# Patient Record
Sex: Female | Born: 1980 | Race: Black or African American | Hispanic: No | Marital: Single | Smoking: Never smoker
Health system: Southern US, Community
[De-identification: ages and names within clinical notes are randomized; demographics above are authoritative.]

## PROBLEM LIST (undated history)

## (undated) DIAGNOSIS — J302 Other seasonal allergic rhinitis: Secondary | ICD-10-CM

## (undated) DIAGNOSIS — Z789 Other specified health status: Secondary | ICD-10-CM

## (undated) DIAGNOSIS — K219 Gastro-esophageal reflux disease without esophagitis: Secondary | ICD-10-CM

## (undated) HISTORY — DX: Gastro-esophageal reflux disease without esophagitis: K21.9

## (undated) HISTORY — DX: Other seasonal allergic rhinitis: J30.2

## (undated) HISTORY — PX: NO PAST SURGERIES: SHX2092

---

## 2000-09-24 ENCOUNTER — Other Ambulatory Visit: Admission: RE | Admit: 2000-09-24 | Discharge: 2000-09-24 | Payer: Self-pay | Admitting: Family Medicine

## 2002-10-08 ENCOUNTER — Ambulatory Visit (HOSPITAL_COMMUNITY): Admission: RE | Admit: 2002-10-08 | Discharge: 2002-10-08 | Payer: Self-pay | Admitting: Family Medicine

## 2002-10-08 ENCOUNTER — Encounter: Payer: Self-pay | Admitting: Family Medicine

## 2005-01-15 ENCOUNTER — Emergency Department (HOSPITAL_COMMUNITY): Admission: EM | Admit: 2005-01-15 | Discharge: 2005-01-15 | Payer: Self-pay | Admitting: Emergency Medicine

## 2005-10-30 ENCOUNTER — Ambulatory Visit: Payer: Self-pay | Admitting: Family Medicine

## 2006-01-27 ENCOUNTER — Ambulatory Visit: Payer: Self-pay | Admitting: Family Medicine

## 2006-01-28 ENCOUNTER — Ambulatory Visit (HOSPITAL_COMMUNITY): Admission: RE | Admit: 2006-01-28 | Discharge: 2006-01-28 | Payer: Self-pay | Admitting: Family Medicine

## 2006-02-03 ENCOUNTER — Encounter (HOSPITAL_COMMUNITY): Admission: RE | Admit: 2006-02-03 | Discharge: 2006-03-05 | Payer: Self-pay | Admitting: Family Medicine

## 2006-02-10 ENCOUNTER — Ambulatory Visit: Payer: Self-pay | Admitting: Family Medicine

## 2006-03-13 ENCOUNTER — Encounter: Admission: RE | Admit: 2006-03-13 | Discharge: 2006-03-13 | Payer: Self-pay | Admitting: General Surgery

## 2006-03-25 ENCOUNTER — Ambulatory Visit: Payer: Self-pay | Admitting: Family Medicine

## 2006-03-25 ENCOUNTER — Ambulatory Visit (HOSPITAL_COMMUNITY): Admission: RE | Admit: 2006-03-25 | Discharge: 2006-03-25 | Payer: Self-pay | Admitting: Family Medicine

## 2006-04-01 ENCOUNTER — Ambulatory Visit: Payer: Self-pay | Admitting: Gastroenterology

## 2006-04-08 ENCOUNTER — Ambulatory Visit (HOSPITAL_COMMUNITY): Admission: RE | Admit: 2006-04-08 | Discharge: 2006-04-08 | Payer: Self-pay | Admitting: Gastroenterology

## 2006-04-08 ENCOUNTER — Ambulatory Visit: Payer: Self-pay | Admitting: Gastroenterology

## 2006-04-08 ENCOUNTER — Encounter (INDEPENDENT_AMBULATORY_CARE_PROVIDER_SITE_OTHER): Payer: Self-pay | Admitting: Specialist

## 2006-04-08 HISTORY — PX: ESOPHAGOGASTRODUODENOSCOPY: SHX1529

## 2006-04-14 ENCOUNTER — Ambulatory Visit: Payer: Self-pay | Admitting: Family Medicine

## 2006-06-27 ENCOUNTER — Ambulatory Visit: Payer: Self-pay | Admitting: Family Medicine

## 2006-08-26 ENCOUNTER — Ambulatory Visit: Payer: Self-pay | Admitting: Family Medicine

## 2006-09-08 ENCOUNTER — Ambulatory Visit: Payer: Self-pay | Admitting: Family Medicine

## 2007-01-22 ENCOUNTER — Encounter: Payer: Self-pay | Admitting: Family Medicine

## 2007-02-09 ENCOUNTER — Ambulatory Visit: Payer: Self-pay | Admitting: Family Medicine

## 2007-09-04 ENCOUNTER — Telehealth (INDEPENDENT_AMBULATORY_CARE_PROVIDER_SITE_OTHER): Payer: Self-pay | Admitting: *Deleted

## 2007-11-30 ENCOUNTER — Other Ambulatory Visit: Admission: RE | Admit: 2007-11-30 | Discharge: 2007-11-30 | Payer: Self-pay | Admitting: Family Medicine

## 2007-11-30 ENCOUNTER — Encounter (INDEPENDENT_AMBULATORY_CARE_PROVIDER_SITE_OTHER): Payer: Self-pay | Admitting: Family Medicine

## 2007-11-30 ENCOUNTER — Ambulatory Visit: Payer: Self-pay | Admitting: Family Medicine

## 2007-11-30 DIAGNOSIS — Z8669 Personal history of other diseases of the nervous system and sense organs: Secondary | ICD-10-CM | POA: Insufficient documentation

## 2008-06-28 ENCOUNTER — Ambulatory Visit: Payer: Self-pay | Admitting: Family Medicine

## 2008-06-28 LAB — CONVERTED CEMR LAB
Bilirubin Urine: NEGATIVE
Blood in Urine, dipstick: NEGATIVE
Nitrite: NEGATIVE
Specific Gravity, Urine: 1.015
Urobilinogen, UA: 0.2
WBC Urine, dipstick: NEGATIVE

## 2008-06-29 ENCOUNTER — Encounter (INDEPENDENT_AMBULATORY_CARE_PROVIDER_SITE_OTHER): Payer: Self-pay | Admitting: Family Medicine

## 2008-07-14 ENCOUNTER — Ambulatory Visit: Payer: Self-pay | Admitting: Family Medicine

## 2008-07-14 DIAGNOSIS — I872 Venous insufficiency (chronic) (peripheral): Secondary | ICD-10-CM | POA: Insufficient documentation

## 2008-10-20 ENCOUNTER — Encounter (INDEPENDENT_AMBULATORY_CARE_PROVIDER_SITE_OTHER): Payer: Self-pay | Admitting: Family Medicine

## 2010-02-11 ENCOUNTER — Encounter: Payer: Self-pay | Admitting: Family Medicine

## 2010-02-19 ENCOUNTER — Other Ambulatory Visit: Payer: Self-pay | Admitting: Family Medicine

## 2010-02-19 ENCOUNTER — Other Ambulatory Visit (HOSPITAL_COMMUNITY)
Admission: RE | Admit: 2010-02-19 | Discharge: 2010-02-19 | Disposition: A | Discharge status: Home / Self Care | Payer: Self-pay | Source: Ambulatory Visit | Attending: Family Medicine | Admitting: Family Medicine

## 2010-02-19 DIAGNOSIS — Z124 Encounter for screening for malignant neoplasm of cervix: Secondary | ICD-10-CM | POA: Insufficient documentation

## 2010-02-20 NOTE — Letter (Signed)
Summary: rpc chart  rpc chart   Imported By: Curtis Sites 08/14/2009 15:07:43  _____________________________________________________________________  External Attachment:    Type:   Image     Comment:   External Document

## 2010-06-08 NOTE — Consult Note (Signed)
Vanessa [name], [name]             ACCOUNT NO.:  [id number]   MEDICAL RECORD NO.:  [id number]          PATIENT TYPE:  AMB   LOCATION:  DAY                           FACILITY:  [facility]   PHYSICIAN:  Kassie Mends, M.D.      DATE OF BIRTH:  1980-06-22   DATE OF CONSULTATION:  04/01/2006  DATE OF DISCHARGE:                                 CONSULTATION   CHIEF COMPLAINT:  Intermittent abdominal pain, bloating for 4 months.   PHYSICIAN REQUESTING CONSULTATION:  Milus Mallick. Lodema Hong, M.D.   HISTORY OF PRESENT ILLNESS:  The patient is a 30 year old African  American female, a patient of Dr. Syliva Overman, who presents for  further evaluation of recurrent abdominal pain and bloating.  For the  past 4 months she has had intermittent episodes.  Predominantly, her  pain is in the epigastrium, left upper quadrant region.  It does not  always appear to be related to meals.  It has woken her up from sleep at  night on several occasions.  The pain generally lasts for 2-3 hours.  There is no associated change in bowel movements.  Her bowel movements  are very regular.  No melena or rectal bleeding.  She has had some  feeling of heartburn and often feels as if her food is not going down  very well.  She has had no vomiting, however.  She complains of a  bloating type feeling.  On five occasions she has had postprandial right  upper quadrant abdominal pain, which lasts for about an hour.  She  states this is unrelated to her other symptoms.   Her workup thus far has included an abdominal ultrasound, which revealed  possible small bilateral nonobstructive renal calculi.  She had a HIDA  scan which revealed a gallbladder ejection fraction of 36%.  Normal is  50% for a fatty meal.  She had a CT of the abdomen and pelvis with  contrast which was unremarkable.  She states she saw a Development worker, international aid  in [place], Dr. Lurene Shadow, and he recommended that she see a  gastroenterologist prior to consideration of  any surgical intervention  at this time.  She states that he told her that he did not feel all of  her symptoms were gallbladder related.   CURRENT MEDICATIONS:  None.  She has not been on any PPI therapy,  although Dr. Lodema Hong did provide her with Aciphex, but she has not  started taking it.  She denies taking any over-the-counter agents.   ALLERGIES:  NO KNOWN DRUG ALLERGIES.   PAST MEDICAL HISTORY:  History of migraines occurring three times a  month on average.   PAST SURGICAL HISTORY:  None.   FAMILY HISTORY:  Mother has had her gallbladder out.  No family history  of colorectal cancer or peptic ulcer disease.   SOCIAL HISTORY:  She is single without children.  She is self-employed  as a Social worker.  She is a nonsmoker.  No alcohol use   REVIEW OF SYSTEMS:  See HPI for GI.  CONSTITUTIONAL:  She has had about  a 10-pound weight  gain in the last 1-2 years.  CARDIOPULMONARY:  No  chest pain or shortness of breath.  GENITOURINARY:  Irregular menses  since age 31.  Last menstrual cycle began 45 days ago.  She reports a  negative pregnancy test.  NEUROLOGIC:  Intermittent migraines seem to be  unrelated to her abdominal pain.   PHYSICAL EXAMINATION:  VITAL SIGNS:  Weight 132, height 5 feet 4 inches,  temp 97.4, blood pressure 98/64, pulse 64.  GENERAL:  A pleasant well-nourished, well-developed young female in no  acute distress.  SKIN:  Warm and dry.  No jaundice.  HEENT:  Sclerae nonicteric.  Oropharyngeal mucosa moist and pink.  CHEST:  Lungs clear to auscultation.  CARDIAC:  Reveals a regular rate and rhythm.  Normal S1-S2.  No murmurs,  rubs or gallops.  ABDOMEN:  Positive bowel sounds.  Abdomen is soft.  She has mild  epigastric tenderness to deep palpation.  No organomegaly or masses.  No  rebound tenderness or guarding.  No abdominal bruits or hernias.  EXTREMITIES:  No edema.   IMPRESSION:  Vanessa [name] is a 30 year old lady with a 40-month history of  intermittent  upper abdominal pain predominantly in epigastrium, left  upper quadrant region.  She does describe some typical reflux symptoms  and dysphagia as well.  She has had several episodes of right upper  quadrant abdominal pain which has been postprandial and may be related  to her gallbladder, but all of her symptoms are not clearly explained by  gallbladder dysfunction.   I do recommend further evaluation of her upper GI tract via EGD.  I  discussed risk, alternatives, and benefits with the patient and she is  agreeable to proceed.   PLAN:  1. EGD with Dr. Cira Servant.  2. Encourage her to take AcipHex 20 mg daily, as requested by Dr.      Lodema Hong previously.  3. Further recommendations to follow.      Tana Coast, P.A.      Kassie Mends, M.D.  Electronically Signed    LL/MEDQ  D:  04/01/2006  T:  04/03/2006  Job:  [id number]   cc:   Milus Mallick. Lodema Hong, M.D.  Fax: [phone]

## 2010-06-08 NOTE — Op Note (Signed)
NAMEGAYLEN, PEREIRA             ACCOUNT NO.:  [id number]   MEDICAL RECORD NO.:  [id number]          PATIENT TYPE:  AMB   LOCATION:  DAY                           FACILITY:  [facility]   PHYSICIAN:  Vanessa [name], M.D.      DATE OF BIRTH:  06/10/1980   DATE OF PROCEDURE:  04/08/2006  DATE OF DISCHARGE:  04/08/2006                               OPERATIVE REPORT   REFERRING PHYSICIAN:  Dr. Syliva Overman.   PROCEDURE:  Esophagogastroduodenoscopy with cold forceps biopsy and  Savary dilation.   INDICATION FOR EXAM:  Vanessa [name] is a 30 year old female with solid  dysphagia and abdominal pain located in the right upper quadrant, left  upper quadrant and periumbilical region that did not respond AcipHex.  She had been seen and evaluated by general surgery in [facility] who  felt that her abdominal pain was multifactorial needed to be further  evaluated.   FINDINGS:  1. 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  Her esophagus was successively dilated with      the Savary dilators from 13 mm to 16 mm.  The 16-mm Savary dilator      was passed with mild to moderate resistance.  Otherwise the distal      esophagus was without evidence of Barrett's, erosion or ulceration.  2. Mild antral erythema.  Biopsies obtained to evaluate for H pylori      infection or eosinophilic gastritis.  3. Normal duodenal bulb, and second portion of the duodenum.   RECOMMENDATIONS:  1. Clear liquid diet for 8 hours then resume previous diet except      avoid gastric irritants.  Vanessa [name] was given handout on gastric      irritants.  She is also given a handout on gastritis.  2. Will call Vanessa [name] with the results of her biopsy.  3. She should avoid aspirin, NSAIDs and anticoagulation for three      days.  4. She has a follow-up appointment to see me in one month to re-      evaluate her dysphagia and abdominal pain.   MEDICATIONS:  1. Demerol 75 mg IV.  2.  Versed 4 mg IV.   PROCEDURE TECHNIQUE:  Physical exam was performed and informed consent  was obtained from the patient after explaining the benefits, risks and  alternatives to the procedure.  The patient was connected to monitor and  placed in the left lateral position continuous oxygen was provided by  nasal cannula at and IV medicine administered through an indwelling  cannula.  After administration of sedation, the patient's esophagus  intubated.  The scope was  advanced direct visualization to second portion of duodenum.  The scope  was subsequently slowly by carefully examining color, texture, anatomy,  and integrity of the mucosa on the way out.  The patient was recovered  in the endoscopy suite and discharged home in satisfactory condition.      Vanessa [name], M.D.  Electronically Signed     SM/MEDQ  D:  04/09/2006  T:  04/10/2006  Job:  [id number]   cc:   Milus Mallick. Lodema Hong, M.D.  Fax: [phone]

## 2010-06-21 ENCOUNTER — Ambulatory Visit (INDEPENDENT_AMBULATORY_CARE_PROVIDER_SITE_OTHER): Payer: PRIVATE HEALTH INSURANCE | Admitting: Family Medicine

## 2010-06-21 ENCOUNTER — Encounter: Payer: Self-pay | Admitting: Family Medicine

## 2010-06-21 VITALS — BP 110/80 | HR 75 | Temp 98.7°F | Resp 16 | Ht 64.0 in | Wt 120.1 lb

## 2010-06-21 DIAGNOSIS — Z23 Encounter for immunization: Secondary | ICD-10-CM

## 2010-06-21 DIAGNOSIS — J0100 Acute maxillary sinusitis, unspecified: Secondary | ICD-10-CM | POA: Insufficient documentation

## 2010-06-21 DIAGNOSIS — Z139 Encounter for screening, unspecified: Secondary | ICD-10-CM

## 2010-06-21 DIAGNOSIS — J40 Bronchitis, not specified as acute or chronic: Secondary | ICD-10-CM

## 2010-06-21 MED ORDER — AZITHROMYCIN 250 MG PO TABS: ORAL_TABLET | ORAL | Status: AC

## 2010-06-21 MED ORDER — CEFTRIAXONE SODIUM 1 G IJ SOLR
500.0000 mg | Freq: Once | INTRAMUSCULAR | Status: AC
  Administered 2010-06-21: 500 mg via INTRAMUSCULAR

## 2010-06-21 MED ORDER — PREDNISONE (PAK) 5 MG PO TABS: 5.0000 mg | ORAL_TABLET | ORAL | Status: AC

## 2010-06-21 MED ORDER — FLUCONAZOLE 150 MG PO TABS: 150.0000 mg | ORAL_TABLET | Freq: Once | ORAL | Status: AC

## 2010-06-21 MED ORDER — METHYLPREDNISOLONE ACETATE 80 MG/ML IJ SUSP
80.0000 mg | Freq: Once | INTRAMUSCULAR | Status: AC
  Administered 2010-06-21: 80 mg via INTRAMUSCULAR

## 2010-06-21 NOTE — Patient Instructions (Addendum)
You  Are being treated for left maxillary sinusitis and acute bronchitis.  Medication is sent in and you will get injections of rocephin and depomedrol in the office , also TDaP vaccine.  I recommend fasting labs at your earliest convenience, cBC, chem 7, lipid ,and TSH    I Hope you feel well soon, call if not.  You will get mucinex d take one twice daily for the next 3 to 5 days also

## 2010-06-21 NOTE — Progress Notes (Signed)
  Subjective:    Patient ID: Vanessa [name], female    DOB: 1981/01/15, 30 y.o.   MRN: [id number]  HPI 4 dy h/o sinus pressure with excescsscive cough with chest discomfort, chills, no documented fever. Prior to this she had been doing well No labs in years, questions about vitamins. Needs immunization    Review of Systems See  hPI Denies palpitations, paroxysmal nocturnal dyspnea, orthopnea and leg swelling Denies abdominal pain, nausea, vomiting,diarrhea or constipation.  Denies rectal bleeding or change in bowel movement. Denies dysuria, frequency, hesitancy or incontinence. Denies joint pain, swelling and limitation in mobility. Denies seizure, numbness, or tingling. Denies depression, anxiety or insomnia. Denies skin break down or rash. Reports headache, generalized pains, chest pain with cough        Objective:   Physical Exam Patient alert and oriented and in no Cardiopulmonary distress.Ill appearing  HEENT: No facial asymmetry, EOMI, left maxillary sinus tenderness, TM's dull, Oropharynx pink and moist.  Neck supple adenitis  Chest: decreased air entry , bilateral crackles, no whezes CVS: S1, S2 no murmurs, no S3.  ABD: Soft non tender. Bowel sounds normal.  Ext: No edema  MS: Adequate ROM spine, shoulders, hips and knees.  Skin: Intact, no ulcerations or rash noted.  Psych: Good eye contact, normal affect. Memory intact not anxious or depressed appearing.  CNS: CN 2-12 intact, power,  normal throughout.        Assessment & Plan:

## 2010-06-24 DIAGNOSIS — J40 Bronchitis, not specified as acute or chronic: Secondary | ICD-10-CM | POA: Insufficient documentation

## 2010-06-24 NOTE — Assessment & Plan Note (Signed)
Med prescribed, advised, fluids , rest, and samples given of decongestant and expectorant

## 2010-06-24 NOTE — Assessment & Plan Note (Signed)
Acute episode, rocephin administered and z pack prescribed, also prednisone dose pack

## 2010-11-28 ENCOUNTER — Telehealth: Payer: Self-pay | Admitting: Family Medicine

## 2010-11-28 ENCOUNTER — Other Ambulatory Visit: Payer: Self-pay | Admitting: Family Medicine

## 2010-11-28 MED ORDER — IBUPROFEN 800 MG PO TABS: ORAL_TABLET | ORAL | Status: DC

## 2010-11-28 NOTE — Telephone Encounter (Signed)
Requests script of ibuprofen for menstrual cramps be sent in, will do same

## 2011-01-23 ENCOUNTER — Ambulatory Visit (HOSPITAL_COMMUNITY)
Admission: RE | Admit: 2011-01-23 | Discharge: 2011-01-23 | Disposition: A | Discharge status: Home / Self Care | Payer: No Typology Code available for payment source | Source: Ambulatory Visit | Attending: Family Medicine | Admitting: Family Medicine

## 2011-01-23 ENCOUNTER — Encounter: Payer: Self-pay | Admitting: Family Medicine

## 2011-01-23 ENCOUNTER — Telehealth: Payer: Self-pay

## 2011-01-23 ENCOUNTER — Ambulatory Visit (INDEPENDENT_AMBULATORY_CARE_PROVIDER_SITE_OTHER): Payer: PRIVATE HEALTH INSURANCE | Admitting: Family Medicine

## 2011-01-23 VITALS — BP 94/74 | HR 113 | Resp 16 | Ht 64.0 in | Wt 126.0 lb

## 2011-01-23 DIAGNOSIS — M25519 Pain in unspecified shoulder: Secondary | ICD-10-CM

## 2011-01-23 DIAGNOSIS — M25511 Pain in right shoulder: Secondary | ICD-10-CM

## 2011-01-23 DIAGNOSIS — R5383 Other fatigue: Secondary | ICD-10-CM

## 2011-01-23 DIAGNOSIS — W19XXXA Unspecified fall, initial encounter: Secondary | ICD-10-CM | POA: Insufficient documentation

## 2011-01-23 DIAGNOSIS — R079 Chest pain, unspecified: Secondary | ICD-10-CM | POA: Insufficient documentation

## 2011-01-23 DIAGNOSIS — R5381 Other malaise: Secondary | ICD-10-CM

## 2011-01-23 DIAGNOSIS — M79609 Pain in unspecified limb: Secondary | ICD-10-CM | POA: Insufficient documentation

## 2011-01-23 DIAGNOSIS — S298XXA Other specified injuries of thorax, initial encounter: Secondary | ICD-10-CM | POA: Insufficient documentation

## 2011-01-23 MED ORDER — IBUPROFEN 800 MG PO TABS: 800.0000 mg | ORAL_TABLET | Freq: Three times a day (TID) | ORAL | Status: AC | PRN

## 2011-01-23 NOTE — Patient Instructions (Signed)
F/u as needed.  pls take ibuprofen 800mg  one three times daily for 10 days, you have soft tissue injury to shoulder and chest wall following the fall.  xrays are ordered to evaluate/rule out fracture, you will be notified of result  pls call and let me know if symptoms are not resolved in 10 days, you will be referred for further management at that time  Fasting labs need to be done

## 2011-01-23 NOTE — Progress Notes (Signed)
Subjective:     Patient ID: Vanessa [name], female   DOB: 07-26-1980, 31 y.o.   MRN: [id number]  HPI Larey Seat while running in a parking lot 5 days ago, direct trauma to hands knees , chest and abdomen.  C/o severe right shoulder pain from ant shoulder to mid upper arm, has had this to some extent in the past, limits mobility of shoulder.  C/o ant chest wall pain, lower chest and epigastric. No head trauma or lacerations.  Denies significant pain in hands and knees   Review of Systems See HPI Denies recent fever or chills. Denies sinus pressure, nasal congestion, ear pain or sore throat. Denies chest congestion, productive cough or wheezing.  Denies skin break down or rash.        Objective:   Physical Exam Patient alert and oriented and in no cardiopulmonary distress.  HEENT: No facial asymmetry, EOMI, no sinus tenderness,  oropharynx pink and moist.  Neck supple no adenopathy.  Chest: Clear to auscultation bilaterally.Tender over lower chest wall anteriorly  CVS: S1, S2 no murmurs, no S3.  ABD: Soft non tender. Bowel sounds normal.  Ext: No edema  MS: Adequate ROM spine, , hips and knees.Decreased in right shoulder, tender over right upper arm  Skin: Intact, no ulcerations or rash noted.  Psych: Good eye contact, normal affect. Memory intact not anxious or depressed appearing.  CNS: CN 2-12 intact, power,  normal throughout.     Assessment:        Plan:

## 2011-01-23 NOTE — Telephone Encounter (Signed)
States she fell Saturday night onto her front right side. She went to urgent care but they do not take her insurance. Wanted to come in here. She is at work but her right shoulder is hurting to lift it up and her right ribs and kneecap is very sore. She does not want to go to ER but wants to see if you will work her in today. Please advise Cell [phone]

## 2011-01-23 NOTE — Telephone Encounter (Signed)
Will work at [employer] today, pt aware

## 2011-01-27 NOTE — Assessment & Plan Note (Signed)
Recent fall with increase pain and limitation in mobility. Clinically low suspicion for fracture, x ray to be obtained to confirm Anti inflammatories aggressively x 7 to 10 days

## 2011-02-25 ENCOUNTER — Ambulatory Visit (HOSPITAL_COMMUNITY): Payer: PRIVATE HEALTH INSURANCE | Admitting: Occupational Therapy

## 2011-02-26 ENCOUNTER — Ambulatory Visit (HOSPITAL_COMMUNITY): Payer: PRIVATE HEALTH INSURANCE | Admitting: Occupational Therapy

## 2011-03-04 ENCOUNTER — Encounter: Payer: PRIVATE HEALTH INSURANCE | Admitting: Family Medicine

## 2011-03-05 ENCOUNTER — Ambulatory Visit (HOSPITAL_COMMUNITY): Payer: PRIVATE HEALTH INSURANCE | Admitting: Occupational Therapy

## 2011-03-30 LAB — CBC
MCH: 29.5 pg (ref 26.0–34.0)
MCHC: 31.8 g/dL (ref 30.0–36.0)
MCV: 92.9 fL (ref 78.0–100.0)
Platelets: 321 10*3/uL (ref 150–400)
RDW: 12.8 % (ref 11.5–15.5)
WBC: 4.9 10*3/uL (ref 4.0–10.5)

## 2011-03-30 LAB — COMPREHENSIVE METABOLIC PANEL
Albumin: 4.4 g/dL (ref 3.5–5.2)
Alkaline Phosphatase: 51 U/L (ref 39–117)
BUN: 19 mg/dL (ref 6–23)
CO2: 25 mEq/L (ref 19–32)
Calcium: 9.4 mg/dL (ref 8.4–10.5)
Potassium: 4.5 mEq/L (ref 3.5–5.3)
Total Bilirubin: 0.3 mg/dL (ref 0.3–1.2)
Total Protein: 6.9 g/dL (ref 6.0–8.3)

## 2011-03-30 LAB — LIPID PANEL
HDL: 63 mg/dL (ref >39)
LDL Cholesterol: 80 mg/dL (ref 0–99)
Total CHOL/HDL Ratio: 2.4 Ratio
Triglycerides: 35 mg/dL (ref <150)

## 2011-04-01 ENCOUNTER — Ambulatory Visit (INDEPENDENT_AMBULATORY_CARE_PROVIDER_SITE_OTHER): Payer: No Typology Code available for payment source | Admitting: Family Medicine

## 2011-04-01 ENCOUNTER — Other Ambulatory Visit (HOSPITAL_COMMUNITY)
Admission: RE | Admit: 2011-04-01 | Discharge: 2011-04-01 | Disposition: A | Discharge status: Home / Self Care | Payer: No Typology Code available for payment source | Source: Ambulatory Visit | Attending: Family Medicine | Admitting: Family Medicine

## 2011-04-01 ENCOUNTER — Encounter: Payer: Self-pay | Admitting: Family Medicine

## 2011-04-01 VITALS — BP 100/60 | HR 78 | Resp 16 | Ht 64.0 in | Wt 128.4 lb

## 2011-04-01 DIAGNOSIS — K649 Unspecified hemorrhoids: Secondary | ICD-10-CM

## 2011-04-01 DIAGNOSIS — M25511 Pain in right shoulder: Secondary | ICD-10-CM

## 2011-04-01 DIAGNOSIS — Z124 Encounter for screening for malignant neoplasm of cervix: Secondary | ICD-10-CM

## 2011-04-01 DIAGNOSIS — M25519 Pain in unspecified shoulder: Secondary | ICD-10-CM

## 2011-04-01 DIAGNOSIS — Z01419 Encounter for gynecological examination (general) (routine) without abnormal findings: Secondary | ICD-10-CM | POA: Insufficient documentation

## 2011-04-01 DIAGNOSIS — K648 Other hemorrhoids: Secondary | ICD-10-CM | POA: Insufficient documentation

## 2011-04-01 DIAGNOSIS — Z0000 Encounter for general adult medical examination without abnormal findings: Secondary | ICD-10-CM

## 2011-04-01 MED ORDER — DICLOFENAC SODIUM 1 % TD GEL: TRANSDERMAL | Status: DC

## 2011-04-01 MED ORDER — HYDROCORTISONE ACETATE 25 MG RE SUPP: 25.0000 mg | Freq: Two times a day (BID) | RECTAL | Status: DC

## 2011-04-01 MED ORDER — IBUPROFEN 600 MG PO TABS: ORAL_TABLET | ORAL | Status: AC

## 2011-04-01 NOTE — Patient Instructions (Signed)
CPE in 12 month.  Please commit to regular exercise, including stretches and exercises for your tummy, also healthy food choices.  Medication is prescribed for shoulder pain and also for cramps , do not use the ibuprofen the same day that you use the voltaren gel.  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

## 2011-04-06 NOTE — Assessment & Plan Note (Signed)
Improved, topical and systemic med prescribed for as needed use independently

## 2011-04-06 NOTE — Progress Notes (Signed)
  Subjective:    Patient ID: Vanessa [name], female    DOB: 10-Jan-1981, 31 y.o.   MRN: [id number]  HPI The PT is here for annual exam aedication management and review of any available recent lab and radiology data.  Preventive health is updated, specifically  Cancer screening and Immunization.   Questions or concerns regarding consultations or procedures which the PT has had in the interim are  Addressed.Has seen orthopedics regarding righ shoulder pain, did not f/u with therapy and is using no medication consistently. Recent bright red rectal blood x 3 occasions in the past week, associated with pain and straining      Review of Systems See HPI Denies recent fever or chills. Denies sinus pressure, nasal congestion, ear pain or sore throat. Denies chest congestion, productive cough or wheezing. Denies chest pains, palpitations and leg swelling Denies abdominal pain, nausea, vomiting,diarrhea or constipation.   Denies dysuria, frequency, hesitancy or incontinence. Denies headaches, seizures, numbness, or tingling. Denies depression, anxiety or insomnia. Denies skin break down or rash.        Objective:   Physical Exam Pleasant well nourished female, alert and oriented x 3, in no cardio-pulmonary distress. Afebrile. HEENT No facial trauma or asymetry. Sinuses non tender.  EOMI, PERTL, fundoscopic exam is normal, no hemorhage or exudate.  External ears normal, tympanic membranes clear. Oropharynx moist, no exudate, good dentition. Neck: supple, no adenopathy,JVD or thyromegaly.No bruits.  Chest: Clear to ascultation bilaterally.No crackles or wheezes. Non tender to palpation  Breast: No asymetry,no masses. No nipple discharge or inversion. No axillary or supraclavicular adenopathy  Cardiovascular system; Heart sounds normal,  S1 and  S2 ,no S3.  No murmur, or thrill. Apical beat not displaced Peripheral pulses normal.  Abdomen: Soft, non tender, no organomegaly or  masses. No bruits. Bowel sounds normal. No guarding, tenderness or rebound.  Rectal:  Hemmorhoid. 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  GU: External genitalia normal. No lesions. Vaginal canal normal.No discharge. Uterus normal size, no adnexal masses, no cervical motion or adnexal tenderness.  Musculoskeletal exam: Full ROM of spine, hips , shoulders and knees. No deformity ,swelling or crepitus noted. No muscle wasting or atrophy.   Neurologic: Cranial nerves 2 to 12 intact. Power, tone ,sensation and reflexes normal throughout. No disturbance in gait. No tremor.  Skin: Intact, no ulceration, erythema , scaling or rash noted. Pigmentation normal throughout  Psych; Normal mood and affect. Judgement and concentration normal        Assessment & Plan:

## 2011-04-06 NOTE — Assessment & Plan Note (Signed)
counseled re importance of not straining at  Stool , which she readily admits to. Lifestyle change , and use of suposotories short term to shrink hemmorhoid. If bleeding persists will need GI eval

## 2011-04-23 ENCOUNTER — Ambulatory Visit (INDEPENDENT_AMBULATORY_CARE_PROVIDER_SITE_OTHER): Payer: No Typology Code available for payment source | Admitting: Family Medicine

## 2011-04-23 ENCOUNTER — Encounter: Payer: Self-pay | Admitting: Family Medicine

## 2011-04-23 ENCOUNTER — Telehealth: Payer: Self-pay | Admitting: Family Medicine

## 2011-04-23 VITALS — BP 118/72 | HR 67 | Resp 18 | Ht 64.0 in | Wt 130.1 lb

## 2011-04-23 DIAGNOSIS — N946 Dysmenorrhea, unspecified: Secondary | ICD-10-CM

## 2011-04-23 DIAGNOSIS — K649 Unspecified hemorrhoids: Secondary | ICD-10-CM

## 2011-04-23 DIAGNOSIS — N3000 Acute cystitis without hematuria: Secondary | ICD-10-CM

## 2011-04-23 DIAGNOSIS — M25511 Pain in right shoulder: Secondary | ICD-10-CM

## 2011-04-23 DIAGNOSIS — M25519 Pain in unspecified shoulder: Secondary | ICD-10-CM

## 2011-04-23 LAB — POCT URINALYSIS DIPSTICK
Bilirubin, UA: NEGATIVE
Glucose, UA: NEGATIVE
Ketones, UA: NEGATIVE
Nitrite, UA: POSITIVE
Spec Grav, UA: >=1.03
Urobilinogen, UA: 0.2
pH, UA: 6

## 2011-04-23 MED ORDER — FLUCONAZOLE 150 MG PO TABS: ORAL_TABLET | ORAL | Status: AC

## 2011-04-23 MED ORDER — CIPROFLOXACIN HCL 500 MG PO TABS: 500.0000 mg | ORAL_TABLET | Freq: Two times a day (BID) | ORAL | Status: AC

## 2011-04-23 MED ORDER — HYDROCORTISONE ACETATE 25 MG RE SUPP: 25.0000 mg | Freq: Two times a day (BID) | RECTAL | Status: AC

## 2011-04-23 MED ORDER — DICLOFENAC SODIUM 1 % TD GEL: TRANSDERMAL | Status: DC

## 2011-04-23 NOTE — Patient Instructions (Addendum)
F/u as before.  You have acute cystitis.Most likely from bacteria in the stool, we will let you know   Take ciprofloxacin twice daily for 5 days, void often , and drink a lot of water.  Fluconazole is sent in , in case you develop vaginal itching from the antibiotic   Urinary Tract Infection Infections of the urinary tract can start in several places. A bladder infection (cystitis), a kidney infection (pyelonephritis), and a prostate infection (prostatitis) are different types of urinary tract infections (UTIs). They usually get better if treated with medicines (antibiotics) that kill germs. Take all the medicine until it is gone. You or your child may feel better in a few days, but TAKE ALL MEDICINE or the infection may not respond and may become more difficult to treat. HOME CARE INSTRUCTIONS   Drink enough water and fluids to keep the urine clear or pale yellow. Cranberry juice is especially recommended, in addition to large amounts of water.   Avoid caffeine, tea, and carbonated beverages. They tend to irritate the bladder.   Alcohol may irritate the prostate.   Only take over-the-counter or prescription medicines for pain, discomfort, or fever as directed by your caregiver.  To prevent further infections:  Empty the bladder often. Avoid holding urine for long periods of time.   After a bowel movement, women should cleanse from front to back. Use each tissue only once.   Empty the bladder before and after sexual intercourse.  FINDING OUT THE RESULTS OF YOUR TEST Not all test results are available during your visit. If your or your child's test results are not back during the visit, make an appointment with your caregiver to find out the results. Do not assume everything is normal if you have not heard from your caregiver or the medical facility. It is important for you to follow up on all test results. SEEK MEDICAL CARE IF:   There is back pain.   Your baby is older than 3  months with a rectal temperature of 100.5 F (38.1 C) or higher for more than 1 day.   Your or your child's problems (symptoms) are no better in 3 days. Return sooner if you or your child is getting worse.  SEEK IMMEDIATE MEDICAL CARE IF:   There is severe back pain or lower abdominal pain.   You or your child develops chills.   You have a fever.   Your baby is older than 3 months with a rectal temperature of 102 F (38.9 C) or higher.   Your baby is 37 months old or younger with a rectal temperature of 100.4 F (38 C) or higher.   There is nausea or vomiting.   There is continued burning or discomfort with urination.  MAKE SURE YOU:   Understand these instructions.   Will watch your condition.   Will get help right away if you are not doing well or get worse.  Document Released: 10/17/2004 Document Revised: 12/27/2010 Document Reviewed: 05/22/2006 [facility] Patient Information 2012 [place], [place].

## 2011-04-23 NOTE — Telephone Encounter (Signed)
Advise her to come in for 2:30 to see me pls

## 2011-04-23 NOTE — Progress Notes (Signed)
  Subjective:    Patient ID: Vanessa [name], female    DOB: 1980/03/19, 31 y.o.   MRN: [id number]  HPI 3 day h/o dysuria and frequency, pelvic pressure, took pyridium last night , little relief. No fever , flank pain or chills. Currently on menses, and reports increased and uncontrolled dysmenoreah wonders if she needs to see a gynecologist regarding this, after discussion as to treatment options not pressed to do so now   Review of Systems See HPI Denies recent fever or chills. Denies sinus pressure, nasal congestion, ear pain or sore throat. Denies chest congestion, productive cough or wheezing. Denies chest pains, palpitations and leg swelling Denies abdominal pain, nausea, vomiting,diarrhea or constipation.    Denies joint pain, swelling and limitation in mobility.Improved right shoulder pain Denies skin break down or rash.        Objective:   Physical Exam  Patient alert and oriented and in no cardiopulmonary distress.Ill appearing HEENT: No facial asymmetry, EOMI, no sinus tenderness,  oropharynx pink and moist.  Neck supple no adenopathy.  Chest: Clear to auscultation bilaterally.  CVS: S1, S2 no murmurs, no S3.  ABD: Soft . Bowel sounds [employer].no suprapubic tenderness, no rnal angle tenderness  Ext: No edema  MS: Adequate ROM spine, shoulders, hips and knees.  Skin: Intact, no ulcerations or rash noted.  Psych: Good eye contact, normal affect.   CNS: CN 2-12 intact, power and sensation normal throughout.       Assessment & Plan:

## 2011-04-25 ENCOUNTER — Ambulatory Visit (INDEPENDENT_AMBULATORY_CARE_PROVIDER_SITE_OTHER): Payer: No Typology Code available for payment source

## 2011-04-25 ENCOUNTER — Telehealth: Payer: Self-pay | Admitting: Family Medicine

## 2011-04-25 DIAGNOSIS — N3090 Cystitis, unspecified without hematuria: Secondary | ICD-10-CM

## 2011-04-25 MED ORDER — CEFTRIAXONE SODIUM 1 G IJ SOLR
500.0000 mg | Freq: Once | INTRAMUSCULAR | Status: AC
  Administered 2011-04-25: 500 mg via INTRAMUSCULAR

## 2011-04-25 NOTE — Telephone Encounter (Signed)
pls let pt know that the result for c/s is not yet available , but please giver her rocephin 500mg  iM as a very broad spectrum antibiotic that should cover, hopefully the c/s report will be available some time tomorrow, takes on avg 3 days pls explain

## 2011-04-25 NOTE — Progress Notes (Signed)
Pt in for rocephin injection per dr.  Injection administered.  No signs or symptoms of adverse reaction.

## 2011-04-25 NOTE — Telephone Encounter (Signed)
Put in as nurse visit

## 2011-04-27 LAB — URINE CULTURE: Colony Count: >=100000

## 2011-04-28 DIAGNOSIS — N946 Dysmenorrhea, unspecified: Secondary | ICD-10-CM | POA: Insufficient documentation

## 2011-04-28 NOTE — Assessment & Plan Note (Signed)
Acute symptoms with positive UA , treat empirically and send for c/s

## 2011-04-28 NOTE — Assessment & Plan Note (Signed)
improved

## 2011-04-28 NOTE — Assessment & Plan Note (Signed)
Severe and disabling offered OCP start, no interest at this time, will increase exercise regime and continue use of anti inflammatories

## 2011-07-11 ENCOUNTER — Telehealth: Payer: Self-pay | Admitting: Gastroenterology

## 2011-07-11 DIAGNOSIS — K625 Hemorrhage of anus and rectum: Secondary | ICD-10-CM

## 2011-07-11 DIAGNOSIS — R1031 Right lower quadrant pain: Secondary | ICD-10-CM

## 2011-07-11 NOTE — Telephone Encounter (Signed)
PT CALLED AND HAVING RECTAL BLEEDING FOR 6 MOS. HAS A BULGE IN HER RLQ THAT IS TENDER. NO DIARRHEA, NAUSEA, OR VOMITING. WEIGHT 128 lBS.  PT NEEDS CT A/P NEXT WEEK W/ IV AND ORAL CONTRAST TO EVALUATE FOR ABDOMINAL PAIN/ HERNIA. SHE WILL NEEDS LABS DRAWN PRIOR TO CT. FAX LAB ORDERS TO [facility]. NEEDS TCS JUL 8 FOR RECTAL BLEEDING-PREPOPIK.  CONTACT PT ON HER CELL OR AT WORK: [phone].

## 2011-07-12 ENCOUNTER — Other Ambulatory Visit: Payer: Self-pay | Admitting: Gastroenterology

## 2011-07-12 DIAGNOSIS — K625 Hemorrhage of anus and rectum: Secondary | ICD-10-CM

## 2011-07-12 DIAGNOSIS — K469 Unspecified abdominal hernia without obstruction or gangrene: Secondary | ICD-10-CM

## 2011-07-12 DIAGNOSIS — R109 Unspecified abdominal pain: Secondary | ICD-10-CM

## 2011-07-12 MED ORDER — SOD PICOSULFATE-MAG OX-CIT ACD 10-3.5-12 MG-GM-GM PO PACK: 1.0000 | PACK | Freq: Once | ORAL | Status: DC

## 2011-07-12 NOTE — Telephone Encounter (Signed)
Patient is scheduled for CT Abd/Pel on 07/17/11 @ 8:00 am NPO 2hrs prior and scheduled for TCS w/SLF on 07/29/11 @ 10:00 am and arrival is at 9:00 am and patient is aware of both

## 2011-07-15 ENCOUNTER — Telehealth: Payer: Self-pay | Admitting: Gastroenterology

## 2011-07-15 NOTE — Telephone Encounter (Signed)
Patients CT scan was reschedule to July 01st at 8:00 am an patient is aware

## 2011-07-16 NOTE — Telephone Encounter (Signed)
REVIEWED.  

## 2011-07-16 NOTE — Telephone Encounter (Signed)
Please call pt to have labs drawn.

## 2011-07-17 ENCOUNTER — Ambulatory Visit (HOSPITAL_COMMUNITY): Payer: No Typology Code available for payment source

## 2011-07-18 ENCOUNTER — Encounter (HOSPITAL_COMMUNITY): Payer: Self-pay | Admitting: Pharmacy Technician

## 2011-07-22 ENCOUNTER — Ambulatory Visit (HOSPITAL_COMMUNITY)
Admission: RE | Admit: 2011-07-22 | Discharge: 2011-07-22 | Disposition: A | Discharge status: Home / Self Care | Payer: No Typology Code available for payment source | Source: Ambulatory Visit | Attending: Gastroenterology | Admitting: Gastroenterology

## 2011-07-22 DIAGNOSIS — R109 Unspecified abdominal pain: Secondary | ICD-10-CM

## 2011-07-22 DIAGNOSIS — K469 Unspecified abdominal hernia without obstruction or gangrene: Secondary | ICD-10-CM

## 2011-07-22 DIAGNOSIS — K644 Residual hemorrhoidal skin tags: Secondary | ICD-10-CM | POA: Insufficient documentation

## 2011-07-22 DIAGNOSIS — K625 Hemorrhage of anus and rectum: Secondary | ICD-10-CM | POA: Insufficient documentation

## 2011-07-22 DIAGNOSIS — R1031 Right lower quadrant pain: Secondary | ICD-10-CM | POA: Insufficient documentation

## 2011-07-22 MED ORDER — IOHEXOL 300 MG/ML  SOLN
100.0000 mL | Freq: Once | INTRAMUSCULAR | Status: AC | PRN
  Administered 2011-07-22: 100 mL via INTRAVENOUS

## 2011-07-22 NOTE — Telephone Encounter (Signed)
LMOM to remind to do the lab work.

## 2011-07-23 ENCOUNTER — Other Ambulatory Visit: Payer: Self-pay | Admitting: Gastroenterology

## 2011-07-23 DIAGNOSIS — R109 Unspecified abdominal pain: Secondary | ICD-10-CM

## 2011-07-23 DIAGNOSIS — K649 Unspecified hemorrhoids: Secondary | ICD-10-CM

## 2011-07-23 DIAGNOSIS — K625 Hemorrhage of anus and rectum: Secondary | ICD-10-CM

## 2011-07-26 MED ORDER — SODIUM CHLORIDE 0.45 % IV SOLN: Freq: Once | INTRAVENOUS | Status: DC

## 2011-07-29 ENCOUNTER — Encounter (HOSPITAL_COMMUNITY)
Admission: RE | Disposition: A | Discharge status: Home / Self Care | Payer: Self-pay | Source: Ambulatory Visit | Attending: Gastroenterology

## 2011-07-29 ENCOUNTER — Ambulatory Visit (HOSPITAL_COMMUNITY)
Admission: RE | Admit: 2011-07-29 | Discharge: 2011-07-29 | Disposition: A | Discharge status: Home / Self Care | Payer: No Typology Code available for payment source | Source: Ambulatory Visit | Attending: Gastroenterology | Admitting: Gastroenterology

## 2011-07-29 ENCOUNTER — Encounter (HOSPITAL_COMMUNITY): Payer: Self-pay

## 2011-07-29 DIAGNOSIS — K648 Other hemorrhoids: Secondary | ICD-10-CM | POA: Insufficient documentation

## 2011-07-29 DIAGNOSIS — R109 Unspecified abdominal pain: Secondary | ICD-10-CM

## 2011-07-29 DIAGNOSIS — K921 Melena: Secondary | ICD-10-CM | POA: Insufficient documentation

## 2011-07-29 DIAGNOSIS — K625 Hemorrhage of anus and rectum: Secondary | ICD-10-CM

## 2011-07-29 DIAGNOSIS — R197 Diarrhea, unspecified: Secondary | ICD-10-CM | POA: Insufficient documentation

## 2011-07-29 HISTORY — PX: COLONOSCOPY: SHX5424

## 2011-07-29 HISTORY — DX: Other specified health status: Z78.9

## 2011-07-29 SURGERY — COLONOSCOPY
Anesthesia: Moderate Sedation

## 2011-07-29 MED ORDER — MEPERIDINE HCL 100 MG/ML IJ SOLN
INTRAMUSCULAR | Status: DC | PRN
  Administered 2011-07-29: 50 mg via INTRAVENOUS
  Administered 2011-07-29: 25 mg via INTRAVENOUS

## 2011-07-29 MED ORDER — MEPERIDINE HCL 100 MG/ML IJ SOLN
INTRAMUSCULAR | Status: AC
  Filled 2011-07-29: qty 1

## 2011-07-29 MED ORDER — MIDAZOLAM HCL 5 MG/5ML IJ SOLN
INTRAMUSCULAR | Status: DC | PRN
  Administered 2011-07-29 (×2): 2 mg via INTRAVENOUS

## 2011-07-29 MED ORDER — MIDAZOLAM HCL 5 MG/5ML IJ SOLN
INTRAMUSCULAR | Status: AC
  Filled 2011-07-29: qty 10

## 2011-07-29 MED ORDER — STERILE WATER FOR IRRIGATION IR SOLN
Status: DC | PRN
  Administered 2011-07-29: 08:00:00

## 2011-07-29 NOTE — Op Note (Signed)
[facility] [address] [place], [place]  [address]  COLONOSCOPY PROCEDURE REPORT  PATIENT:  Vanessa [name], Vanessa [name]  MR#:  [id number] BIRTHDATE:  1980/08/07, 31 yrs. old  GENDER:  female  ENDOSCOPIST:  Jonette Eva, MD REF. BY:  Syliva Overman, M.D. ASSISTANT:  PROCEDURE DATE:  07/29/2011 PROCEDURE:  ILEOColonoscopy with biopsy  INDICATIONS:  INTERMITTENT DIARRHEA/RECTAL BLEEDING/ABDOMINAL PAIN   MEDICATIONS:   Demerol 75 mg IV, Versed 4 mg IV  DESCRIPTION OF PROCEDURE:    Physical exam was performed. Informed consent was obtained from the patient after explaining the benefits, risks, and alternatives to procedure.  The patient was connected to monitor and placed in left lateral position. Continuous oxygen was provided by nasal cannula and IV medicine administered through an indwelling cannula.  After administration of sedation and rectal exam, the patient's rectum was intubated and the EC-[id number] ([id number]) colonoscope was advanced under direct visualization to the ILEUM. The scope was removed slowly by carefully examining the color, texture, anatomy, and integrity mucosa on the way out.  The patient was recovered in endoscopy and discharged home in satisfactory condition. <<PROCEDUREIMAGES>>  FINDINGS:  NOMRLA ILEUM   10 CM VISUALIZED. Normal colonoscopy without polyps, masses, vascular ectasias, or inflammatory changes.  Internal Hemorrhoids were found.  PREP QUALITY: EXCELLENT CECAL W/D TIME:    9 minutes  COMPLICATIONS:    None  ENDOSCOPIC IMPRESSION: RECTAL BLEEDING DUE TO HEMORRHOIDS  RECOMMENDATIONS: AWAIT BIOPSIES HIGH FIBER DIET TCS AT AGE 66  REPEAT EXAM:  No  ______________________________ Jonette Eva, MD  CC:  Syliva Overman, M.D.  n. eSIGNED:   Sandi Fields at 07/29/2011 08:22 AM  Belia Heman, [phone]

## 2011-07-29 NOTE — H&P (Addendum)
  Primary Care Physician:  Syliva Overman, MD Primary Gastroenterologist:  Dr. Darrick Penna  Pre-Procedure History & Physical: HPI:  Vanessa [name] is a 31 y.o. female here for RECTAL BLEEDING/INTERMITTENT DIARRHEA.  Past Medical History  Diagnosis Date  . No pertinent past medical history     Past Surgical History  Procedure Date  . No past surgeries     Prior to Admission medications   Medication Sig Start Date End Date Taking? Authorizing Provider  ibuprofen (ADVIL,MOTRIN) 800 MG tablet Take 800 mg by mouth every 8 (eight) hours as needed. For pain   Yes Historical Provider, MD    Allergies as of 07/12/2011  . (No Known Allergies)    History reviewed. No pertinent family history.  History   Social History  . Marital Status: Single    Spouse Name: N/A    Number of Children: N/A  . Years of Education: N/A   Occupational History  . Not on file.   Social History Main Topics  . Smoking status: Never Smoker   . Smokeless tobacco: Not on file  . Alcohol Use: No  . Drug Use: No  . Sexually Active: Not on file   Other Topics Concern  . Not on file   Social History Narrative  . No narrative on file    Review of Systems: See HPI, otherwise negative ROS   Physical Exam: Pulse 82  Temp 98.1 F (36.7 C) (Oral)  Resp 21  Ht 5\' 2"  (1.575 m)  Wt 130 lb (58.968 kg)  BMI 23.78 kg/m2  SpO2 100%  LMP 07/22/2011 General:   Alert,  pleasant and cooperative in NAD Head:  Normocephalic and atraumatic. Neck:  Supple;  Lungs:  Clear throughout to auscultation.    Heart:  Regular rate and rhythm. Abdomen:  Soft, nontender and nondistended. Normal bowel sounds, without guarding, and without rebound.   Neurologic:  Alert and  oriented x4;  grossly normal neurologically.  Impression/Plan:    Intermittent diarrhea/rectal bleeding WHEN SHE WIPES. LMP 2 WEEKS AGO. PLAN: 1. TCS TODAY

## 2011-07-31 ENCOUNTER — Telehealth: Payer: Self-pay | Admitting: Gastroenterology

## 2011-07-31 NOTE — Telephone Encounter (Signed)
CALLED PT. COLON Bx NL.

## 2011-08-01 ENCOUNTER — Encounter (HOSPITAL_COMMUNITY): Payer: Self-pay | Admitting: Gastroenterology

## 2011-10-27 ENCOUNTER — Telehealth: Payer: Self-pay | Admitting: Family Medicine

## 2011-10-27 DIAGNOSIS — N946 Dysmenorrhea, unspecified: Secondary | ICD-10-CM

## 2011-10-27 DIAGNOSIS — N920 Excessive and frequent menstruation with regular cycle: Secondary | ICD-10-CM

## 2011-10-27 NOTE — Telephone Encounter (Signed)
Pt reports single episode of light headedness, felt a though she would pass out, followed by the passage of heavy clots with her menses. She has been troubled with painful, heavy menses for years, does not want to be on the OCP, is "afraid of cancer" Pelvic ultrasond when done in the past shows normal anatomy, hence pai is likely due to endometriosis. This has been an ongoing problem, I believe gyne eval is appropriate, esp since pt very hesitannt and resistant to hormonal manipulation. Will refer specifically to Dr Cherly Hensen, she  agrees.

## 2012-01-03 ENCOUNTER — Other Ambulatory Visit: Payer: Self-pay | Admitting: Family Medicine

## 2012-01-03 ENCOUNTER — Other Ambulatory Visit: Payer: Self-pay

## 2012-01-03 ENCOUNTER — Telehealth: Payer: Self-pay | Admitting: Family Medicine

## 2012-01-03 MED ORDER — PROMETHAZINE HCL 12.5 MG PO TABS: ORAL_TABLET | ORAL | Status: DC

## 2012-01-03 MED ORDER — DIPHENOXYLATE-ATROPINE 2.5-0.025 MG PO TABS: 1.0000 | ORAL_TABLET | Freq: Four times a day (QID) | ORAL | Status: DC | PRN

## 2012-01-03 MED ORDER — LOPERAMIDE HCL 2 MG PO TABS: ORAL_TABLET | ORAL | Status: DC

## 2012-01-03 NOTE — Telephone Encounter (Signed)
Advise over oTC imodium one once to twice daily for the next 2 to 4 days till stools settle, or I will send in lomotil which works the same , also zofran for nausea.Meds entered historically, send what she needs pls. If she is using imodium do not send the lomotil Advise BRAT and hand hygiene, sounds like stomach virus, also needs to keep her "stress level" down

## 2012-01-03 NOTE — Telephone Encounter (Signed)
Phenergan entered  instead of zofran, I think m, more affordable

## 2012-01-03 NOTE — Telephone Encounter (Signed)
Patient aware and states that she understands  

## 2012-01-03 NOTE — Telephone Encounter (Signed)
Patient states that she has had diarrhea since Monday.  States that it is typically in the morning and maybe once during the day.  Also stated that it woke her out of her sleep this am.  Some nausea as well.

## 2012-01-20 ENCOUNTER — Encounter: Payer: Self-pay | Admitting: Family Medicine

## 2012-01-20 ENCOUNTER — Ambulatory Visit (INDEPENDENT_AMBULATORY_CARE_PROVIDER_SITE_OTHER): Payer: No Typology Code available for payment source | Admitting: Family Medicine

## 2012-01-20 VITALS — BP 110/70 | HR 100 | Temp 100.9°F | Resp 16 | Ht 64.0 in | Wt 128.4 lb

## 2012-01-20 DIAGNOSIS — J40 Bronchitis, not specified as acute or chronic: Secondary | ICD-10-CM

## 2012-01-20 DIAGNOSIS — J111 Influenza due to unidentified influenza virus with other respiratory manifestations: Secondary | ICD-10-CM | POA: Insufficient documentation

## 2012-01-20 MED ORDER — PROMETHAZINE-DM 6.25-15 MG/5ML PO SYRP: ORAL_SOLUTION | ORAL | Status: AC

## 2012-01-20 MED ORDER — OSELTAMIVIR PHOSPHATE 75 MG PO CAPS: 75.0000 mg | ORAL_CAPSULE | Freq: Two times a day (BID) | ORAL | Status: DC

## 2012-01-20 MED ORDER — FLUCONAZOLE 150 MG PO TABS: ORAL_TABLET | ORAL | Status: AC

## 2012-01-20 MED ORDER — AZITHROMYCIN 250 MG PO TABS: ORAL_TABLET | ORAL | Status: AC

## 2012-01-20 NOTE — Progress Notes (Signed)
  Subjective:    Patient ID: Vanessa [name], female    DOB: 05/13/1980, 31 y.o.   MRN: [id number]  HPI 3 day h/o generalized body aches, fever chills and sore throat. Head congestion and cough. Has been using OTC decongestants and ibuprofen. Temp at visit markedly elevated.c/o frontal headache, Cough productive of scant yellow sputum. No known sick contact, has not had the influenza vaccine   Review of Systems See HPI Denies chest pains, palpitations and leg swelling Denies abdominal pain, nausea, vomiting,diarrhea or constipation.   Denies dysuria, frequency, hesitancy or incontinence. Denies joint pain, swelling and limitation in mobility. Denies headaches, seizures, numbness, or tingling. Denies depression, anxiety or insomnia. Denies skin break down or rash.        Objective:   Physical Exam  Patient alert and oriented and in no cardiopulmonary distress.Ill appearing and febrile  HEENT: No facial asymmetry, EOMI, frontal sinus tenderness,  oropharynx pink and moist.  Neck supple anterior cervical  adenopathy.  Chest: Adequate air entry bilaterally , few scattered crackles, no wheezes  CVS: S1, S2 no murmurs, no S3.  ABD: Soft non tender. Bowel sounds normal.  Ext: No edema  MS: Adequate ROM spine, shoulders, hips and knees.  Skin: Intact, no ulcerations or rash noted.  Psych: Good eye contact, normal affect. Memory intact not anxious or depressed appearing.  CNS: CN 2-12 intact, power, tone and sensation normal throughout.       Assessment & Plan:

## 2012-01-20 NOTE — Patient Instructions (Addendum)
CPE March 12 or after.  You are being treated for influenza and acute bronchitis.  You will get printed information. Four scripts are sent in, OK to leave the fluconazole tablet in the pharmacy, fill only if needed, for vaginal itch and discharge following antibiotic course.   REduced work schedule starting on Thursday, if you are able, please call if condition is worsening.  Lots of rest, fluids , warm soup, and medications as prescribed

## 2012-01-22 NOTE — Assessment & Plan Note (Signed)
Antiviral, antipyretics, fluids and rest

## 2012-01-22 NOTE — Assessment & Plan Note (Signed)
Decongestant and antibioitc

## 2012-01-31 ENCOUNTER — Telehealth: Payer: Self-pay | Admitting: Family Medicine

## 2012-01-31 MED ORDER — PREDNISONE 5 MG PO TABS: ORAL_TABLET | ORAL | Status: AC

## 2012-01-31 NOTE — Telephone Encounter (Signed)
C/o post nasal drain esp at night and irritative cough with tickle . Will send in short sharp steroid course, pt aware

## 2012-04-15 ENCOUNTER — Telehealth: Payer: Self-pay | Admitting: Gastroenterology

## 2012-04-15 DIAGNOSIS — R197 Diarrhea, unspecified: Secondary | ICD-10-CM | POA: Insufficient documentation

## 2012-04-15 NOTE — Telephone Encounter (Signed)
Pt called to discuss diarrhea. Having postprandial loose stools/diarrhea-foul smelling. Rare BRBPR. PT DENIES FEVER, CHILLS, BRBPR, nausea, vomiting, melena, constipation, heartburn or indigestion. EATING MORE FRUITS/VEGETABLES. NO INCREASE IN DAIRY OR NSAIDS. NO TRAVEL. OCCASIONAL MILD CRAMPY ABD PAIN ASSOCIATED WITH RECTAL URGENCY. PT WORKS AS A HAIR STYLIST.  PT WILL STOP BY OFC MAR 27 TO OBTAIN STOOL SAMPLE CONTAINERS. BETWEEN 11-130AM.

## 2012-04-15 NOTE — Assessment & Plan Note (Signed)
MOST LIKELY POST-INFECTIOUS IBS, LESS LIKELY INFECTION OR CELIAC SPRUE  GIARDIA AG, C DIFF PCR, FECAL LACTOFERRIN TTG IgA

## 2012-04-16 NOTE — Telephone Encounter (Signed)
Containers and orders are at front for pick up. And also lab that Dr. Darrick Penna ordered.

## 2012-08-25 ENCOUNTER — Encounter: Payer: Self-pay | Admitting: Family Medicine

## 2012-08-25 ENCOUNTER — Telehealth: Payer: Self-pay | Admitting: Family Medicine

## 2012-08-25 ENCOUNTER — Ambulatory Visit (INDEPENDENT_AMBULATORY_CARE_PROVIDER_SITE_OTHER): Payer: No Typology Code available for payment source | Admitting: Family Medicine

## 2012-08-25 VITALS — BP 118/74 | HR 71 | Resp 16 | Ht 64.0 in | Wt 125.0 lb

## 2012-08-25 DIAGNOSIS — R110 Nausea: Secondary | ICD-10-CM

## 2012-08-25 DIAGNOSIS — Z139 Encounter for screening, unspecified: Secondary | ICD-10-CM

## 2012-08-25 DIAGNOSIS — G43909 Migraine, unspecified, not intractable, without status migrainosus: Secondary | ICD-10-CM

## 2012-08-25 DIAGNOSIS — R5381 Other malaise: Secondary | ICD-10-CM

## 2012-08-25 MED ORDER — ONDANSETRON HCL 4 MG/2ML IJ SOLN
4.0000 mg | Freq: Once | INTRAMUSCULAR | Status: AC
  Administered 2012-08-25: 4 mg via INTRAMUSCULAR

## 2012-08-25 MED ORDER — KETOROLAC TROMETHAMINE 60 MG/2ML IM SOLN
60.0000 mg | Freq: Once | INTRAMUSCULAR | Status: AC
  Administered 2012-08-25: 60 mg via INTRAMUSCULAR

## 2012-08-25 MED ORDER — IBUPROFEN 800 MG PO TABS: ORAL_TABLET | ORAL | Status: DC

## 2012-08-25 MED ORDER — SUMATRIPTAN SUCCINATE 100 MG PO TABS: ORAL_TABLET | ORAL | Status: DC

## 2012-08-25 MED ORDER — METHYLPREDNISOLONE ACETATE 80 MG/ML IJ SUSP
80.0000 mg | Freq: Once | INTRAMUSCULAR | Status: AC
  Administered 2012-08-25: 80 mg via INTRAMUSCULAR

## 2012-08-25 MED ORDER — PREDNISONE 5 MG PO TABS: 5.0000 mg | ORAL_TABLET | Freq: Two times a day (BID) | ORAL | Status: AC

## 2012-08-25 NOTE — Progress Notes (Signed)
  Subjective:    Patient ID: Vanessa [name], female    DOB: 26-Sep-1980, 32 y.o.   MRN: [id number]  HPI 3 day h/o pressure over right eye triggered by salty meal, ongoing , to the extent  That pt unable to drive home from [place], now a 7 , at it's worst over a 10 Nausea,  This morning, no emesis, taking ibuprofen , no relief. Speech, noise, light aggravating the headache, anticipates menses in the next week, and this also has been a trigger Denies increased stress , anxiety , depression. 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 Last presented with headache approx 1 year ago, was referred for brain scan and did not f/u, based on family history of brain aneurysms in aunt, I again recommend a scan    Review of Systems See HPI Denies recent fever or chills. Denies sinus pressure, nasal congestion, ear pain or sore throat. Denies chest congestion, productive cough or wheezing. Denies chest pains, palpitations and leg swelling Denies abdominal pain, diarrhea or constipation. Poor appetite since symptom onset  Denies dysuria, frequency, hesitancy or incontinence. Denies joint pain, swelling and limitation in mobility. Denies  seizures, numbness, or tingling. Denies depression, anxiety or insomnia. Denies skin break down or rash.        Objective:   Physical Exam  Patient alert and oriented and in no cardiopulmonary distress.Pt in pain  HEENT: No facial asymmetry, EOMI,PERTL, no sinus tenderness,  oropharynx pink and moist.  Neck supple no adenopathy.  Chest: Clear to auscultation bilaterally.  CVS: S1, S2 no murmurs, no S3.  ABD: Soft non tender. Bowel sounds normal.  Ext: No edema  MS: Adequate ROM spine,  shoulders, hips and knees.  Skin: Intact, no ulcerations or rash noted.  Psych: Good eye contact, normal affect. Memory intact not anxious or depressed appearing.  CNS: CN 2-12 intact, power, tone and sensation normal throughout.       Assessment & Plan:

## 2012-08-25 NOTE — Telephone Encounter (Signed)
Has a migraine and wants to come in for appt.  Had migraine for 3 days and starting to hurt to talk. Wants to be seen today   [phone]

## 2012-08-25 NOTE — Telephone Encounter (Signed)
appt today pls 

## 2012-08-25 NOTE — Telephone Encounter (Signed)
Coming for appt today at 2:15

## 2012-08-25 NOTE — Patient Instructions (Addendum)
F/U in January, call if you need me before  Pls call for flu vaccine early October   Fasting lipid, chem 7, CBC, vit D and TSH mid to end September   You are given toradol 60mg  IM, depo medrol 80 mg IM and zofran 4mg  Im for headache and nausea   Please take prednisone twice daily for the next 5 days and 1 imitrex tablet today when you go home. If head still hurting may repeat this after 2 hours  See if it is possible for you to sleep in late tomorrow morning  New for your migraines is imitrex at start of headache, follow directions please, no more than 2 in 24 hours  Try to keep a diary of migraine triggers so that you are able to avoid them  I am referring you for a brain scan due to f/h of brain aneurysm

## 2012-08-27 ENCOUNTER — Other Ambulatory Visit (HOSPITAL_COMMUNITY): Payer: No Typology Code available for payment source

## 2012-08-31 ENCOUNTER — Ambulatory Visit (HOSPITAL_COMMUNITY): Payer: No Typology Code available for payment source

## 2012-09-05 NOTE — Assessment & Plan Note (Signed)
Uncontrolled , needs to resume diary, also use tryptan at headache onset. Re educated pt about migraines. 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 zofran for nausea Since generally headaches are predictably peri menstrual only , will not start prophylaxis MRI brain due to severity and duration of current headache , not her typical headache, also due to family history

## 2012-10-23 ENCOUNTER — Other Ambulatory Visit: Payer: Self-pay | Admitting: Family Medicine

## 2012-10-23 MED ORDER — IBUPROFEN 800 MG PO TABS: ORAL_TABLET | ORAL | Status: DC

## 2012-11-26 ENCOUNTER — Other Ambulatory Visit: Payer: Self-pay

## 2013-01-02 ENCOUNTER — Telehealth: Payer: Self-pay | Admitting: Family Medicine

## 2013-01-02 MED ORDER — DIPHENOXYLATE-ATROPINE 2.5-0.025 MG/5ML PO LIQD: 5.0000 mL | Freq: Four times a day (QID) | ORAL | Status: DC | PRN

## 2013-01-02 MED ORDER — PROMETHAZINE HCL 12.5 MG PO TABS: 12.5000 mg | ORAL_TABLET | Freq: Four times a day (QID) | ORAL | Status: DC | PRN

## 2013-01-02 NOTE — Telephone Encounter (Signed)
3 day h/o watery diarreah, slight nausea, no vomiting Will rx lomotil and phenergan tabs advised brat

## 2013-01-04 ENCOUNTER — Encounter: Payer: Self-pay | Admitting: Family Medicine

## 2013-01-04 ENCOUNTER — Ambulatory Visit (INDEPENDENT_AMBULATORY_CARE_PROVIDER_SITE_OTHER): Payer: No Typology Code available for payment source | Admitting: Family Medicine

## 2013-01-04 VITALS — BP 100/64 | HR 75 | Temp 98.7°F | Resp 16 | Ht 64.0 in | Wt 127.0 lb

## 2013-01-04 DIAGNOSIS — K529 Noninfective gastroenteritis and colitis, unspecified: Secondary | ICD-10-CM

## 2013-01-04 DIAGNOSIS — K5289 Other specified noninfective gastroenteritis and colitis: Secondary | ICD-10-CM

## 2013-01-04 DIAGNOSIS — R197 Diarrhea, unspecified: Secondary | ICD-10-CM

## 2013-01-04 MED ORDER — CIPROFLOXACIN HCL 500 MG PO TABS: 500.0000 mg | ORAL_TABLET | Freq: Two times a day (BID) | ORAL | Status: AC

## 2013-01-04 NOTE — Progress Notes (Signed)
   Subjective:    Patient ID: Vanessa [name], female    DOB: 1980-12-17, 32 y.o.   MRN: [id number]  HPI 5 day h/o loose watery stool no blood or mucus , , stool is yellow, then mint green , now  [employer]. No fever, chills, or nausea, vomit once this morning while brushing her teeth. Frequency may be up to 15 times in 24 hours. Pt is progressively weak, no other family members affected, started lomotil 1 day ago was feeling better but now worse. Symptoms started the day of eating in local restaurants all 3 meals   Review of Systems See HPI Denies recent fever or chills. Denies sinus pressure, nasal congestion, ear pain or sore throat. Denies chest congestion, productive cough or wheezing. Denies chest pains, palpitations and leg swelling    Denies dysuria, frequency, hesitancy or incontinence.        Objective:   Physical Exam  Patient alert and oriented and in no cardiopulmonary distress.Ill appearing  HEENT: No facial asymmetry, EOMI, no sinus tenderness,  oropharynx pink and moist.  Neck supple no adenopathy.  Chest: Clear to auscultation bilaterally.  CVS: S1, S2 no murmurs, no S3.  ABD: Soft diffuse superficial tenderness, no guarding or rebound. No organomegaly or  Mass. Normal bowel sounds  Ext: No edema        Assessment & Plan:

## 2013-01-04 NOTE — Patient Instructions (Addendum)
F/u as needed, next pap due in 2016.  You are treated for acute gastroenteritis.  Antibiotic for 3 days is prescribed, since symptoms are prolonged and severe. Stool is being sent for culture for bacteria and parasites. I believe the infection is from contaminated food or drink  Continue to practice good hand hygiene to prevent spread  Work on eating small amounts of bland food often, and ensure that you keep fliuid intake up so you do not get dehydrated   CBC and diff and chem 7 stat today, results will be sent to you  Diet for Diarrhea, Adult Frequent, runny stools (diarrhea) may be caused or worsened by food or drink. Diarrhea may be relieved by changing your diet. Since diarrhea can last up to 7 days, it is easy for you to lose too much fluid from the body and become dehydrated. Fluids that are lost need to be replaced. Along with a modified diet, make sure you drink enough fluids to keep your urine clear or pale yellow. DIET INSTRUCTIONS  Ensure adequate fluid intake (hydration): have 1 cup (8 oz) of fluid for each diarrhea episode. Avoid fluids that contain simple sugars or sports drinks, fruit juices, whole milk products, and sodas. Your urine should be clear or pale yellow if you are drinking enough fluids. Hydrate with an oral rehydration solution that you can purchase at pharmacies, retail stores, and online. You can prepare an oral rehydration solution at home by mixing the following ingredients together:    tsp table salt.   tsp baking soda.   tsp salt substitute containing potassium chloride.  1  tablespoons sugar.  1 L (34 oz) of water.  Certain foods and beverages may increase the speed at which food moves through the gastrointestinal (GI) tract. These foods and beverages should be avoided and include:  Caffeinated and alcoholic beverages.  High-fiber foods, such as raw fruits and vegetables, nuts, seeds, and whole grain breads and cereals.  Foods and beverages  sweetened with sugar alcohols, such as xylitol, sorbitol, and mannitol.  Some foods may be well tolerated and may help thicken stool including:  Starchy foods, such as rice, toast, pasta, low-sugar cereal, oatmeal, grits, baked potatoes, crackers, and bagels.   Bananas.   Applesauce.  Add probiotic-rich foods to help increase healthy bacteria in the GI tract, such as yogurt and fermented milk products. RECOMMENDED FOODS AND BEVERAGES Starches Choose foods with less than 2 g of fiber per serving.  Recommended:  White, [place], and pita breads, plain rolls, buns, bagels. Plain muffins, matzo. Soda, saltine, or graham crackers. Pretzels, melba toast, zwieback. Cooked cereals made with water: cornmeal, farina, cream cereals. Dry cereals: refined corn, wheat, rice. Potatoes prepared any way without skins, refined macaroni, spaghetti, noodles, refined rice.  Avoid:  Bread, rolls, or crackers made with whole wheat, multi-grains, rye, bran seeds, nuts, or coconut. Corn tortillas or taco shells. Cereals containing whole grains, multi-grains, bran, coconut, nuts, raisins. Cooked or dry oatmeal. Coarse wheat cereals, granola. Cereals advertised as "high-fiber." Potato skins. Whole grain pasta, wild or brown rice. Popcorn. Sweet potatoes, yams. Sweet rolls, doughnuts, waffles, pancakes, sweet breads. Vegetables  Recommended: Strained tomato and vegetable juices. Most well-cooked and canned vegetables without seeds. Fresh: Tender lettuce, cucumber without the skin, cabbage, spinach, bean sprouts.  Avoid: Fresh, cooked, or canned: Artichokes, baked beans, beet greens, broccoli, Brussels sprouts, corn, kale, legumes, peas, sweet potatoes. Cooked: Green or red cabbage, spinach. Avoid large servings of any vegetables because vegetables shrink when cooked,  and they contain more fiber per serving than fresh vegetables. Fruit  Recommended: Cooked or canned: Apricots, applesauce, cantaloupe, cherries, fruit  cocktail, grapefruit, grapes, kiwi, mandarin oranges, peaches, pears, plums, watermelon. Fresh: Apples without skin, ripe banana, grapes, cantaloupe, cherries, grapefruit, peaches, oranges, plums. Keep servings limited to  cup or 1 piece.  Avoid: Fresh: Apples with skin, apricots, mangoes, pears, raspberries, strawberries. Prune juice, stewed or dried prunes. Dried fruits, raisins, dates. Large servings of all fresh fruits. Protein  Recommended: Ground or well-cooked tender beef, ham, veal, lamb, pork, or poultry. Eggs. Fish, oysters, shrimp, lobster, other seafoods. Liver, organ meats.  Avoid: Tough, fibrous meats with gristle. Peanut butter, smooth or chunky. Cheese, nuts, seeds, legumes, dried peas, beans, lentils. Dairy  Recommended: Yogurt, lactose-free milk, kefir, drinkable yogurt, buttermilk, soy milk, or plain hard cheese.  Avoid: Milk, chocolate milk, beverages made with milk, such as milkshakes. Soups  Recommended: Bouillon, broth, or soups made from allowed foods. Any strained soup.  Avoid: Soups made from vegetables that are not allowed, cream or milk-based soups. Desserts and Sweets  Recommended: Sugar-free gelatin, sugar-free frozen ice pops made without sugar alcohol.  Avoid: Plain cakes and cookies, pie made with fruit, pudding, custard, cream pie. Gelatin, fruit, ice, sherbet, frozen ice pops. Ice cream, ice milk without nuts. Plain hard candy, honey, jelly, molasses, syrup, sugar, chocolate syrup, gumdrops, marshmallows. Fats and Oils  Recommended: Limit fats to less than 8 tsp per day.  Avoid: Seeds, nuts, olives, avocados. Margarine, butter, cream, mayonnaise, salad oils, plain salad dressings. Plain gravy, crisp bacon without rind. Beverages  Recommended: Water, decaffeinated teas, oral rehydration solutions, sugar-free beverages not sweetened with sugar alcohols.  Avoid: Fruit juices, caffeinated beverages (coffee, tea, soda), alcohol, sports drinks, or  lemon-lime soda. Condiments  Recommended: Ketchup, mustard, horseradish, vinegar, cocoa powder. Spices in moderation: allspice, basil, bay leaves, celery powder or leaves, cinnamon, cumin powder, curry powder, ginger, mace, marjoram, onion or garlic powder, oregano, paprika, parsley flakes, ground pepper, rosemary, sage, savory, tarragon, thyme, turmeric.  Avoid: Coconut, honey. Document Released: 03/30/2003 Document Revised: 10/02/2011 Document Reviewed: 05/24/2011 [facility] Patient Information 2014 [place], [place].

## 2013-01-05 ENCOUNTER — Other Ambulatory Visit: Payer: Self-pay | Admitting: Family Medicine

## 2013-01-05 LAB — CLOSTRIDIUM DIFFICILE EIA: CDIFTX: NEGATIVE

## 2013-01-06 ENCOUNTER — Encounter: Payer: Self-pay | Admitting: Family Medicine

## 2013-01-06 LAB — OVA AND PARASITE EXAMINATION

## 2013-01-09 DIAGNOSIS — K529 Noninfective gastroenteritis and colitis, unspecified: Secondary | ICD-10-CM | POA: Insufficient documentation

## 2013-01-09 LAB — STOOL CULTURE

## 2013-01-09 NOTE — Assessment & Plan Note (Signed)
Acute onset of explosive diarheah , prolonged will test for o/p  As well as bacteria. Addition of cipro x 3 days  Due to prolonged symptoms Hand hygiene stressed BRAT diet discussed and literature provided

## 2013-02-05 ENCOUNTER — Telehealth: Payer: Self-pay | Admitting: Family Medicine

## 2013-02-05 MED ORDER — IBUPROFEN 800 MG PO TABS: 800.0000 mg | ORAL_TABLET | Freq: Three times a day (TID) | ORAL | Status: DC | PRN

## 2013-02-05 NOTE — Telephone Encounter (Signed)
Pt fell one day ago and injured both knees and also fell on hands requests refill on ibuprofen, will send in tonight

## 2013-03-30 ENCOUNTER — Other Ambulatory Visit: Payer: Self-pay | Admitting: Family Medicine

## 2013-03-30 ENCOUNTER — Ambulatory Visit (INDEPENDENT_AMBULATORY_CARE_PROVIDER_SITE_OTHER): Payer: 59 | Admitting: Family Medicine

## 2013-03-30 ENCOUNTER — Telehealth: Payer: Self-pay

## 2013-03-30 ENCOUNTER — Encounter: Payer: Self-pay | Admitting: Family Medicine

## 2013-03-30 VITALS — BP 108/72 | HR 66 | Resp 16 | Ht 64.0 in | Wt 132.8 lb

## 2013-03-30 DIAGNOSIS — K219 Gastro-esophageal reflux disease without esophagitis: Secondary | ICD-10-CM

## 2013-03-30 DIAGNOSIS — R071 Chest pain on breathing: Secondary | ICD-10-CM

## 2013-03-30 DIAGNOSIS — R079 Chest pain, unspecified: Secondary | ICD-10-CM

## 2013-03-30 DIAGNOSIS — G43909 Migraine, unspecified, not intractable, without status migrainosus: Secondary | ICD-10-CM

## 2013-03-30 DIAGNOSIS — R0789 Other chest pain: Secondary | ICD-10-CM

## 2013-03-30 DIAGNOSIS — R5383 Other fatigue: Principal | ICD-10-CM

## 2013-03-30 DIAGNOSIS — R5381 Other malaise: Secondary | ICD-10-CM

## 2013-03-30 LAB — BASIC METABOLIC PANEL
BUN: 13 mg/dL (ref 6–23)
CO2: 28 mEq/L (ref 19–32)
Calcium: 8.9 mg/dL (ref 8.4–10.5)
Chloride: 107 mEq/L (ref 96–112)
Creat: 0.74 mg/dL (ref 0.50–1.10)
Glucose, Bld: 77 mg/dL (ref 70–99)
POTASSIUM: 4.4 meq/L (ref 3.5–5.3)
Sodium: 142 mEq/L (ref 135–145)

## 2013-03-30 LAB — CBC
HCT: 35.1 % — ABNORMAL LOW (ref 36.0–46.0)
Hemoglobin: 11.7 g/dL — ABNORMAL LOW (ref 12.0–15.0)
MCH: 29.7 pg (ref 26.0–34.0)
MCHC: 33.3 g/dL (ref 30.0–36.0)
MCV: 89.1 fL (ref 78.0–100.0)
Platelets: 306 10*3/uL (ref 150–400)
RBC: 3.94 MIL/uL (ref 3.87–5.11)
RDW: 13.7 % (ref 11.5–15.5)
WBC: 5.6 10*3/uL (ref 4.0–10.5)

## 2013-03-30 MED ORDER — SUMATRIPTAN SUCCINATE 100 MG PO TABS: ORAL_TABLET | ORAL | Status: DC

## 2013-03-30 MED ORDER — DEXLANSOPRAZOLE 30 MG PO CPDR: 30.0000 mg | DELAYED_RELEASE_CAPSULE | Freq: Every day | ORAL | Status: DC

## 2013-03-30 MED ORDER — IBUPROFEN 800 MG PO TABS: 800.0000 mg | ORAL_TABLET | Freq: Three times a day (TID) | ORAL | Status: DC | PRN

## 2013-03-30 MED ORDER — TOPIRAMATE 25 MG PO TABS: ORAL_TABLET | ORAL | Status: DC

## 2013-03-30 NOTE — Patient Instructions (Addendum)
F/u in 4 month, call if you need me before  You will receive lab results on "my chart"  You have chest wall pain and reflux  EKG today is normal, no sign of heart damage  Take ibuprofen 800mg  one twice daily for 3 days for chest wall pain, and also dexilant one twice daily for the next week, then one daily as prescribed  Please commit to topamax every night to reduce frequency of headaches and use imitrex as directed for a bad headache  Chest Wall Pain Chest wall pain is pain felt in or around the chest bones and muscles. It may take up to 6 weeks to get better. It may take longer if you are active. Chest wall pain can happen on its own. Other times, things like germs, injury, coughing, or exercise can cause the pain. HOME CARE   Avoid activities that make you tired or cause pain. Try not to use your chest, belly (abdominal), or side muscles. Do not use heavy weights.  Put ice on the sore area.  Put ice in a plastic bag.  Place a towel between your skin and the bag.  Leave the ice on for 15-20 minutes for the first 2 days.  Only take medicine as told by your doctor. GET HELP RIGHT AWAY IF:   You have more pain or are very uncomfortable.  You have a fever.  Your chest pain gets worse.  You have new problems.  You feel sick to your stomach (nauseous) or throw up (vomit).  You start to sweat or feel lightheaded.  You have a cough with mucus (phlegm).  You cough up blood. MAKE SURE YOU:   Understand these instructions.  Will watch your condition.  Will get help right away if you are not doing well or get worse. Document Released: 06/26/2007 Document Revised: 04/01/2011 Document Reviewed: 09/03/2010 [facility] Patient Information 2014 [place], [place]. Migraine Headache A migraine headache is very bad, throbbing pain on one or both sides of your head. Talk to your doctor about what things may bring on (trigger) your migraine headaches. HOME CARE  Only take medicines  as told by your doctor.  Lie down in a dark, quiet room when you have a migraine.  Keep a journal to find out if certain things bring on migraine headaches. For example, write down:  What you eat and drink.  How much sleep you get.  Any change to your diet or medicines.  Lessen how much alcohol you drink.  Quit smoking if you smoke.  Get enough sleep.  Lessen any stress in your life.  Keep lights dim if bright lights bother you or make your migraines worse. GET HELP RIGHT AWAY IF:   Your migraine becomes really bad.  You have a fever.  You have a stiff neck.  You have trouble seeing.  Your muscles are weak, or you lose muscle control.  You lose your balance or have trouble walking.  You feel like you will pass out (faint), or you pass out.  You have really bad symptoms that are different than your first symptoms. MAKE SURE YOU:   Understand these instructions.  Will watch your condition.  Will get help right away if you are not doing well or get worse. Document Released: 10/17/2007 Document Revised: 04/01/2011 Document Reviewed: 09/14/2012 [employer] Patient Information 2014 [employer]. iggers please  You are referred to Dr Darrick PennaFields re GERD and dysphagia, (difficulty swallowing)

## 2013-03-30 NOTE — Telephone Encounter (Signed)
Patient states that pain is in middle of chest.  Does not radiate into arms or neck. Direct pressure does aggravate the pain.  She will have the labs done shortly.

## 2013-03-30 NOTE — Telephone Encounter (Signed)
Weight on center of chest, rated at an 8 started from 10 am yesterday, stayed on worse when turned on left, Worse when you ben

## 2013-03-30 NOTE — Telephone Encounter (Signed)
Where is location of chest pain, center, left , right , does direct pressure to an area aggravate. Any assocd symptoms eg, nausea, excess sweating. Does the pain radiate? What has she taken and what is it rated at? Labs needed are CBC, chem 7 and TSH today pls  Send me back response pls

## 2013-03-30 NOTE — Telephone Encounter (Signed)
Ov today.

## 2013-03-31 ENCOUNTER — Encounter: Payer: Self-pay | Admitting: Gastroenterology

## 2013-03-31 ENCOUNTER — Encounter: Payer: Self-pay | Admitting: Family Medicine

## 2013-03-31 LAB — TSH: TSH: 1.417 u[IU]/mL (ref 0.350–4.500)

## 2013-03-31 LAB — IRON: IRON: 114 ug/dL (ref 42–145)

## 2013-03-31 LAB — FERRITIN: Ferritin: 15 ng/mL (ref 10–291)

## 2013-04-01 ENCOUNTER — Encounter: Payer: Self-pay | Admitting: Family Medicine

## 2013-04-01 LAB — H. PYLORI ANTIBODY, IGG: H Pylori IgG: <0.4 {ISR}

## 2013-04-28 ENCOUNTER — Telehealth: Payer: Self-pay

## 2013-04-28 MED ORDER — FLUTICASONE PROPIONATE 50 MCG/ACT NA SUSP: 2.0000 | Freq: Every day | NASAL | Status: DC

## 2013-04-28 MED ORDER — PREDNISONE 5 MG PO TABS: 5.0000 mg | ORAL_TABLET | Freq: Every day | ORAL | Status: DC

## 2013-04-28 NOTE — Telephone Encounter (Signed)
Patient aware and meds sent 

## 2013-04-28 NOTE — Telephone Encounter (Signed)
Advise add flonase spray daily, pls erx  2 puffs per nostril daily #1 refill 3, continue once daily allegra, start saline nasal flushes/netty pot twice daily for nasal congestion, use sudafed one daily if excessive nasal drainage, offer and erx in pred 5mg  one twice daily #6 only (3 days). This will help to get excessive allergy symptoms under control faster. Advise since she has nor fever chills etc, no need for or benefit of antibiotics, just needs to control her allergy symptoms doing the things above every day until the flare of allergies dies down. May need to do this for the entire Spring or Summer and it is safe to use the allegra, flonase saline flushes every day

## 2013-04-28 NOTE — Telephone Encounter (Signed)
Patient describing allergy symptoms Itchy throat, aches, clear nasal drainage, sneezing, dry cough during the day and at night she coughs up a little clear mucus x 2 days. Started taking her allegra again lastnight. Just feels bad. Please advise

## 2013-04-28 NOTE — Addendum Note (Signed)
Addended by: Abner GreenspanHUDY, BRANDI H on: 04/28/2013 03:17 PM   Modules accepted: Orders

## 2013-04-29 ENCOUNTER — Encounter: Payer: Self-pay | Admitting: Family Medicine

## 2013-04-29 ENCOUNTER — Ambulatory Visit (INDEPENDENT_AMBULATORY_CARE_PROVIDER_SITE_OTHER): Payer: 59 | Admitting: Gastroenterology

## 2013-04-29 ENCOUNTER — Encounter: Payer: Self-pay | Admitting: Gastroenterology

## 2013-04-29 ENCOUNTER — Ambulatory Visit (INDEPENDENT_AMBULATORY_CARE_PROVIDER_SITE_OTHER): Payer: 59 | Admitting: Family Medicine

## 2013-04-29 VITALS — BP 105/65 | HR 80 | Temp 98.4°F | Ht 64.0 in | Wt 127.6 lb

## 2013-04-29 VITALS — BP 108/68 | HR 73 | Temp 98.4°F | Resp 16 | Ht 64.0 in | Wt 127.1 lb

## 2013-04-29 DIAGNOSIS — R071 Chest pain on breathing: Secondary | ICD-10-CM

## 2013-04-29 DIAGNOSIS — K219 Gastro-esophageal reflux disease without esophagitis: Secondary | ICD-10-CM

## 2013-04-29 DIAGNOSIS — R0789 Other chest pain: Secondary | ICD-10-CM

## 2013-04-29 DIAGNOSIS — Z8669 Personal history of other diseases of the nervous system and sense organs: Secondary | ICD-10-CM

## 2013-04-29 DIAGNOSIS — K648 Other hemorrhoids: Secondary | ICD-10-CM

## 2013-04-29 DIAGNOSIS — J0100 Acute maxillary sinusitis, unspecified: Secondary | ICD-10-CM

## 2013-04-29 MED ORDER — PANTOPRAZOLE SODIUM 40 MG PO TBEC: DELAYED_RELEASE_TABLET | ORAL | Status: DC

## 2013-04-29 MED ORDER — PENICILLIN V POTASSIUM 500 MG PO TABS: 500.0000 mg | ORAL_TABLET | Freq: Three times a day (TID) | ORAL | Status: DC

## 2013-04-29 NOTE — Patient Instructions (Signed)
TAKE PROTONIX OR DEXILANT DAILY FOR 3 MOS.  AVOID FOOD THAT TRIGGERS REFLUX. SEE INFO BELOW. \  FOLLOW A HIGH FIBER/LOW FAT DIET. SEE INFO BELOW.  CALL WITH QUESTIONS OR CONCERNS.   Low-Fat Diet BREADS, CEREALS, PASTA, RICE, DRIED PEAS, AND BEANS These products are high in carbohydrates and most are low in fat. Therefore, they can be increased in the diet as substitutes for fatty foods. They too, however, contain calories and should not be eaten in excess. Cereals can be eaten for snacks as well as for breakfast.   FRUITS AND VEGETABLES It is good to eat fruits and vegetables. Besides being sources of fiber, both are rich in vitamins and some minerals. They help you get the daily allowances of these nutrients. Fruits and vegetables can be used for snacks and desserts.  MEATS Limit lean meat, chicken, [place], and fish to no more than 6 ounces per day. Beef, Pork, and Lamb Use lean cuts of beef, pork, and lamb. Lean cuts include:  Extra-lean ground beef.  Arm roast.  Sirloin tip.  Center-cut ham.  Round steak.  Loin chops.  Rump roast.  Tenderloin.  Trim all fat off the outside of meats before cooking. It is not necessary to severely decrease the intake of red meat, but lean choices should be made. Lean meat is rich in protein and contains a highly absorbable form of iron. Premenopausal women, in particular, should avoid reducing lean red meat because this could increase the risk for low red blood cells (iron-deficiency anemia).  Chicken and [place] These are good sources of protein. The fat of poultry can be reduced by removing the skin and underlying fat layers before cooking. Chicken and [place] can be substituted for lean red meat in the diet. Poultry should not be fried or covered with high-fat sauces. Fish and Shellfish Fish is a good source of protein. Shellfish contain cholesterol, but they usually are low in saturated fatty acids. The preparation of fish is important. Like  chicken and [place], they should not be fried or covered with high-fat sauces. EGGS Egg whites contain no fat or cholesterol. They can be eaten often. Try 1 to 2 egg whites instead of whole eggs in recipes or use egg substitutes that do not contain yolk. MILK AND DAIRY PRODUCTS Use skim or 1% milk instead of 2% or whole milk. Decrease whole milk, natural, and processed cheeses. Use nonfat or low-fat (2%) cottage cheese or low-fat cheeses made from vegetable oils. Choose nonfat or low-fat (1 to 2%) yogurt. Experiment with evaporated skim milk in recipes that call for heavy cream. Substitute low-fat yogurt or low-fat cottage cheese for sour cream in dips and salad dressings. Have at least 2 servings of low-fat dairy products, such as 2 glasses of skim (or 1%) milk each day to help get your daily calcium intake. FATS AND OILS Reduce the total intake of fats, especially saturated fat. Butterfat, lard, and beef fats are high in saturated fat and cholesterol. These should be avoided as much as possible. Vegetable fats do not contain cholesterol, but certain vegetable fats, such as coconut oil, palm oil, and palm kernel oil are very high in saturated fats. These should be limited. These fats are often used in bakery goods, processed foods, popcorn, oils, and nondairy creamers. Vegetable shortenings and some peanut butters contain hydrogenated oils, which are also saturated fats. Read the labels on these foods and check for saturated vegetable oils. Unsaturated vegetable oils and fats do not raise blood cholesterol.  However, they should be limited because they are fats and are high in calories. Total fat should still be limited to 30% of your daily caloric intake. Desirable liquid vegetable oils are corn oil, cottonseed oil, olive oil, canola oil, safflower oil, soybean oil, and sunflower oil. Peanut oil is not as good, but small amounts are acceptable. Buy a heart-healthy tub margarine that has no partially  hydrogenated oils in the ingredients. Mayonnaise and salad dressings often are made from unsaturated fats, but they should also be limited because of their high calorie and fat content. Seeds, nuts, peanut butter, olives, and avocados are high in fat, but the fat is mainly the unsaturated type. These foods should be limited mainly to avoid excess calories and fat. OTHER EATING TIPS Snacks  Most sweets should be limited as snacks. They tend to be rich in calories and fats, and their caloric content outweighs their nutritional value. Some good choices in snacks are graham crackers, melba toast, soda crackers, bagels (no egg), English muffins, fruits, and vegetables. These snacks are preferable to snack crackers, [place] fries, TORTILLA CHIPS, and POTATO chips. Popcorn should be air-popped or cooked in small amounts of liquid vegetable oil. Desserts Eat fruit, low-fat yogurt, and fruit ices instead of pastries, cake, and cookies. Sherbet, angel food cake, gelatin dessert, frozen low-fat yogurt, or other frozen products that do not contain saturated fat (pure fruit juice bars, frozen ice pops) are also acceptable.  COOKING METHODS Choose those methods that use little or no fat. They include: Poaching.  Braising.  Steaming.  Grilling.  Baking.  Stir-frying.  Broiling.  Microwaving.  Foods can be cooked in a nonstick pan without added fat, or use a nonfat cooking spray in regular cookware. Limit fried foods and avoid frying in saturated fat. Add moisture to lean meats by using water, broth, cooking wines, and other nonfat or low-fat sauces along with the cooking methods mentioned above. Soups and stews should be chilled after cooking. The fat that forms on top after a few hours in the refrigerator should be skimmed off. When preparing meals, avoid using excess salt. Salt can contribute to raising blood pressure in some people.  EATING AWAY FROM HOME Order entres, potatoes, and vegetables without  sauces or butter. When meat exceeds the size of a deck of cards (3 to 4 ounces), the rest can be taken home for another meal. Choose vegetable or fruit salads and ask for low-calorie salad dressings to be served on the side. Use dressings sparingly. Limit high-fat toppings, such as bacon, crumbled eggs, cheese, sunflower seeds, and olives. Ask for heart-healthy tub margarine instead of butter.  High-Fiber Diet A high-fiber diet changes your normal diet to include more whole grains, legumes, fruits, and vegetables. Changes in the diet involve replacing refined carbohydrates with unrefined foods. The calorie level of the diet is essentially unchanged. The Dietary Reference Intake (recommended amount) for adult males is 38 grams per day. For adult females, it is 25 grams per day. Pregnant and lactating women should consume 28 grams of fiber per day. Fiber is the intact part of a plant that is not broken down during digestion. Functional fiber is fiber that has been isolated from the plant to provide a beneficial effect in the body. PURPOSE  Increase stool bulk.   Ease and regulate bowel movements.   Lower cholesterol.  INDICATIONS THAT YOU NEED MORE FIBER  Constipation and hemorrhoids.   Uncomplicated diverticulosis (intestine condition) and irritable bowel syndrome.   Weight management.  As a protective measure against hardening of the arteries (atherosclerosis), diabetes, and cancer.   GUIDELINES FOR INCREASING FIBER IN THE DIET  Start adding fiber to the diet slowly. A gradual increase of about 5 more grams (2 slices of whole-wheat bread, 2 servings of most fruits or vegetables, or 1 bowl of high-fiber cereal) per day is best. Too rapid an increase in fiber may result in constipation, flatulence, and bloating.   Drink enough water and fluids to keep your urine clear or pale yellow. Water, juice, or caffeine-free drinks are recommended. Not drinking enough fluid may cause constipation.    Eat a variety of high-fiber foods rather than one type of fiber.   Try to increase your intake of fiber through using high-fiber foods rather than fiber pills or supplements that contain small amounts of fiber.   The goal is to change the types of food eaten. Do not supplement your present diet with high-fiber foods, but replace foods in your present diet.  INCLUDE A VARIETY OF FIBER SOURCES  Replace refined and processed grains with whole grains, canned fruits with fresh fruits, and incorporate other fiber sources. White rice, white breads, and most bakery goods contain little or no fiber.   Brown whole-grain rice, buckwheat oats, and many fruits and vegetables are all good sources of fiber. These include: broccoli, Brussels sprouts, cabbage, cauliflower, beets, sweet potatoes, white potatoes (skin on), carrots, tomatoes, eggplant, squash, berries, fresh fruits, and dried fruits.   Cereals appear to be the richest source of fiber. Cereal fiber is found in whole grains and bran. Bran is the fiber-rich outer coat of cereal grain, which is largely removed in refining. In whole-grain cereals, the bran remains. In breakfast cereals, the largest amount of fiber is found in those with "bran" in their names. The fiber content is sometimes indicated on the label.   You may need to include additional fruits and vegetables each day.   In baking, for 1 cup white flour, you may use the following substitutions:   1 cup whole-wheat flour minus 2 tablespoons.   1/2 cup white flour plus 1/2 cup whole-wheat flour.

## 2013-04-29 NOTE — Assessment & Plan Note (Signed)
SX INTERMITTENT.  DISCUSSED BANDING VIA FLEX SIG OR [facility] BANDING. PT WILL CALL WITH QUESTIONS OR CONCERNS.

## 2013-04-29 NOTE — Patient Instructions (Signed)
F/uas needed, call if you need me.  Penicillin is sent in for 1 week, please take entire course   You are being treated for acute maxillary sinusitis  Please take iron 325 mg one daily

## 2013-04-29 NOTE — Assessment & Plan Note (Signed)
SX ARE INTERMITTENT.  ADD PPI DAILY LOW FAT DIET AVOID TRIGGERS OPV PRN

## 2013-04-29 NOTE — Progress Notes (Signed)
   Subjective:    Patient ID: Vanessa [name], female    DOB: November 26, 1980, 33 y.o.   MRN: [id number]  Syliva OvermanMargaret Simpson, MD  HPI CHEST PAIN A MONTH AGO AND DR. Lodema HongSIMPSON SAID IT WAS CHEST WALL. BENT DOWN AND THEN FELT LIKE WEIGHT WERE ON HER CHEST. SX LASTED 3 DAYS. ALL DAY EVERY DAY AND MIGHT HAPPEN WHILE SHE WAS DRIVING. DIDN'T GET DEXILANT BECAUSE INSURANCE WOULD NOT COVER IT. NOT BEEN DEALING WITH INDIGESTION. NO PAIN WITH SWALLOWING. IF SHE EATS RED MEAT SHE HAS CHEST PAIN. NO ETOH/TOBACCO. HAS URI AND LOW GRADE FEVER TODAY WITH NASAL CONGESTION/MYALGIAS. NO ASPIRIN, BC/GOODY POWDERS, IBUPROFEN/MOTRIN, OR NAPROXEN/ALEVE. RARE BRBPR. PT DENIES FEVER, CHILLS, BRBPR, nausea, vomiting, melena, diarrhea, constipation, OR abd pain. ONLY HAS problems swallowing WHEN SHE EATS RED MEAT.    Past Medical History  Diagnosis Date  . No pertinent past medical history   . Seasonal allergies   . GERD (gastroesophageal reflux disease)     INTERMITTENT SYMPTOMS    Past Surgical History  Procedure Laterality Date  . No past surgeries    . Colonoscopy  07/29/2011    Procedure: COLONOSCOPY;  Surgeon: West BaliSandi L Casson Catena, MD;  Location: [facility];  Service: Endoscopy;  Laterality: N/A;  10:00    No Known Allergies  Current Outpatient Prescriptions  Medication Sig Dispense Refill  . fluticasone (FLONASE) 50 MCG/ACT nasal spray Place 2 sprays into both nostrils daily.    Marland Kitchen. ibuprofen (ADVIL,MOTRIN) 800 MG tablet Take 1 tablet (800 mg total) by mouth every 8 (eight) hours as needed.    . predniSONE (DELTASONE) 5 MG tablet Take 1 tablet (5 mg total) by mouth daily with breakfast.    . SUMAtriptan (IMITREX) 100 MG tablet May repeat in 2 hours if headache persists or recurs.Maximum of 2 tablets in 24 hours. Maximum use is twice weekly    . topiramate (TOPAMAX) 25 MG tablet Daily at bedtime    .       Family History  Problem Relation Age of Onset  . Colon cancer Neg Hx   . Colon polyps Neg Hx    History    Substance Use Topics  . Smoking status: Never Smoker   . Smokeless tobacco: Not on file  . Alcohol Use: No       Review of Systems PER HPI OTHERWISE ALL SYSTEMS ARE NEGATIVE.    Objective:   Physical Exam  Vitals reviewed. Constitutional: She is oriented to person, place, and time. She appears well-nourished. She appears distressed (MILD).  HENT:  Head: Normocephalic and atraumatic.  Mouth/Throat: Oropharynx is clear and moist. No oropharyngeal exudate.  Eyes: Pupils are equal, round, and reactive to light. No scleral icterus.  Neck: Normal range of motion. Neck supple.  Cardiovascular: Normal rate, regular rhythm and normal heart sounds.   Pulmonary/Chest: Effort normal and breath sounds normal. No respiratory distress.  Abdominal: Soft. Bowel sounds are normal. She exhibits no distension. There is no tenderness.  Musculoskeletal: She exhibits no edema.  Lymphadenopathy:    She has no cervical adenopathy.  Neurological: She is alert and oriented to person, place, and time.  NO FOCAL DEFICITS   Psychiatric:  FLAT AFFECT, NL MOOD          Assessment & Plan:

## 2013-04-30 NOTE — Assessment & Plan Note (Signed)
Reportedly improved, not as frequent and responds to ibuprofen Reports that they are mainly premenstrual

## 2013-04-30 NOTE — Assessment & Plan Note (Signed)
Antibiotic course, short course of prednisone and saline flushes 2 to 3 times daily

## 2013-04-30 NOTE — Progress Notes (Signed)
   Subjective:    Patient ID: Vanessa [name], female    DOB: Feb 07, 1980, 33 y.o.   MRN: [id number]  HPI  2 day h/o acute onset of body aches, chills , fever to 101, facial pressure, in particular over the right cheek, yellow green nasal drainage and non productive cough, mainly experienced as a tickle in the back of the throat. Experiencing generalized body aches also and fatigue. Prior to this she had been doing well.  Review of Systems See HPI Denies abdominal pain, nausea, vomiting,diarrhea or constipation.   Denies dysuria, frequency, hesitancy or incontinence. c/o headaches,migraine type and mainly premenstrual, ddenies  seizures, numbness, or tingling. Denies depression, anxiety or insomnia. Denies skin break down or rash.         Objective:   Physical Exam BP 108/68  Pulse 73  Temp(Src) 98.4 F (36.9 C) (Oral)  Resp 16  Ht 5\' 4"  (1.626 m)  Wt 127 lb 1.9 oz (57.661 kg)  BMI 21.81 kg/m2  SpO2 100%  LMP 04/07/2013 Patient alert and oriented and in no cardiopulmonary distress.Patient does not appear acutely ill, she does have some head and chest  congestion however  HEENT: No facial asymmetry, EOMI, right maxillary  sinus tenderness,  oropharynx pink and moist. No erythema or exudate Neck supple rigth anterior adenopathy adenopathy.  Chest: Clear to auscultation bilaterally.  CVS: S1, S2 no murmurs, no S3.  ABD: Soft non tender. Bowel sounds normal.  Ext: No edema  MS: Adequate ROM spine, shoulders, hips and knees.  Skin: Intact, no ulcerations or rash noted.  Psych: Good eye contact, normal affect. Memory intact not anxious or depressed appearing.  CNS: CN 2-12 intact, power,  normal throughout.        Assessment & Plan:  Acute maxillary sinusitis Antibiotic course, short course of prednisone and saline flushes 2 to 3 times daily  History of migraine headaches Reportedly improved, not as frequent and responds to ibuprofen Reports that they are  mainly premenstrual  Chest wall pain resolved

## 2013-04-30 NOTE — Assessment & Plan Note (Signed)
resolved 

## 2013-06-14 DIAGNOSIS — R0789 Other chest pain: Secondary | ICD-10-CM | POA: Insufficient documentation

## 2013-06-14 NOTE — Progress Notes (Signed)
   Subjective:    Patient ID: Vanessa [name], female    DOB: 1980-05-28, 33 y.o.   MRN: [id number]  HPI 2 day h/o chest pain , accompanied by nausea. Non radiaiting, no associated diaphoresis or lightheadedness. No specific  relieveing factor noted. Pt reports that movement aggravates her symptom Denies any recent fever, chills , cough or uncontrolled sinus draianage C/o increased headache frequency in past several months, has neither preventive or abortive medication although this has been prescribed in the past   Review of Systems See HPI Denies recent fever or chills. Denies sinus pressure, nasal congestion, ear pain or sore throat. Denies chest congestion, productive cough or wheezing. Denies PND, orthopnea, palpitations and leg swelling Denies abdominal pain,  vomiting,diarrhea or constipation.   Denies dysuria, frequency, hesitancy or incontinence. Denies joint pain, swelling and limitation in mobility. Denies seizures, numbness, or tingling. Denies depression, anxiety or insomnia. Denies skin break down or rash.        Objective:   Physical Exam BP 108/72  Pulse 66  Resp 16  Ht 5\' 4"  (1.626 m)  Wt 132 lb 12.8 oz (60.238 kg)  BMI 22.78 kg/m2  SpO2 100% Patient alert and oriented and in no cardiopulmonary distress.  HEENT: No facial asymmetry, EOMI, no sinus tenderness,  oropharynx pink and moist.  Neck supple no adenopathy.  Chest: Clear to auscultation bilaterally.Reproducible left anterior chest pain CC junctions 2and 3  CVS: S1, S2 no murmurs, no S3.  ABD: Soft non tender. Bowel sounds normal.  Ext: No edema  MS: Adequate ROM spine, shoulders, hips and knees.  Skin: Intact, no ulcerations or rash noted.  Psych: Good eye contact, normal affect. Memory intact not anxious or depressed appearing.  CNS: CN 2-12 intact, power, tone and sensation normal throughout.        Assessment & Plan:

## 2013-06-14 NOTE — Assessment & Plan Note (Addendum)
Uncontrolled headaches, increased frequency, non compliant with medication prescribed. Pt re educated re headaches , she is to start a diary and also topamax. Ibuprofen for acute headache, and imitrex if needed

## 2013-06-14 NOTE — Assessment & Plan Note (Signed)
Localized reproducible anterior left chest pain CC junctions 2 and 3, ibuprofen

## 2013-06-14 NOTE — Assessment & Plan Note (Signed)
Office EKG shows NSR, no ischemia. Pain likely due to uncontrolled reflux, med started and pt also referred to GI

## 2013-06-14 NOTE — Assessment & Plan Note (Signed)
Uncontrolled symptoms contributing to chest pain also c/o dysphagia , will refer to GI for eval

## 2013-12-30 ENCOUNTER — Ambulatory Visit (INDEPENDENT_AMBULATORY_CARE_PROVIDER_SITE_OTHER): Payer: 59 | Admitting: Family Medicine

## 2013-12-30 ENCOUNTER — Encounter: Payer: Self-pay | Admitting: Family Medicine

## 2013-12-30 VITALS — BP 110/78 | HR 85 | Resp 16 | Ht 64.0 in | Wt 133.0 lb

## 2013-12-30 DIAGNOSIS — T300 Burn of unspecified body region, unspecified degree: Secondary | ICD-10-CM

## 2013-12-30 MED ORDER — CEPHALEXIN 500 MG PO CAPS: 500.0000 mg | ORAL_CAPSULE | Freq: Two times a day (BID) | ORAL | Status: DC

## 2013-12-30 MED ORDER — IBUPROFEN 800 MG PO TABS: ORAL_TABLET | ORAL | Status: DC
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
## 2014-01-04 ENCOUNTER — Ambulatory Visit: Payer: 59
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
## 2014-03-05 ENCOUNTER — Telehealth: Payer: Self-pay | Admitting: Family Medicine

## 2014-03-05 DIAGNOSIS — J040 Acute laryngitis: Secondary | ICD-10-CM

## 2014-03-05 MED ORDER — OMEPRAZOLE 40 MG PO CPDR: 40.0000 mg | DELAYED_RELEASE_CAPSULE | Freq: Every day | ORAL | Status: DC

## 2014-03-05 MED ORDER — PREDNISONE 5 MG PO TABS: 5.0000 mg | ORAL_TABLET | Freq: Two times a day (BID) | ORAL | Status: AC

## 2014-03-05 NOTE — Telephone Encounter (Addendum)
3 week h/o intermittent voice loss, on avg twice per week. No fever, chills or post nasal drainage. Painless .Denies difficulty breathing or swallowing Established GERD history Advised voice rest, salt water gargles, daily PPI and refer to ENT Omeprazole 40mg  and prednisone 5mg  #10 prescribed.

## 2014-03-17 ENCOUNTER — Telehealth: Payer: Self-pay | Admitting: *Deleted

## 2014-03-17 ENCOUNTER — Telehealth: Payer: Self-pay | Admitting: Family Medicine

## 2014-03-17 DIAGNOSIS — R490 Dysphonia: Secondary | ICD-10-CM

## 2014-03-17 NOTE — Telephone Encounter (Signed)
States she went and saw Dr Lazarus SalinesWolicki and he basically just said to use nexium but she is wanting another opinion. Her voice is still very raspy/hoarse and when she tries to force herself to talk it hurts in her throat. Please advise. Has been going on a month.

## 2014-03-17 NOTE — Telephone Encounter (Signed)
Pt called [facility] for a nurse to return her call. Please advise

## 2014-03-17 NOTE — Telephone Encounter (Signed)
I have entered referral for 2nd opinion

## 2014-03-17 NOTE — Telephone Encounter (Signed)
Luann pls remove  referral to ENT, she does not need a 2nd opinion, she understands this, she ha s not started the med I prescribed 2 weeks ago, which is the same med the ENT Doc told her today with the same diagnosis  Brandi, pt did not fill the omeprazole I sent in 2 weeks ago, ENT Doc saw her today, gave her the same dx I did and told her to take the omeprazole. SHe stated when her mother went to fill her script the pharmacy ??said something about needing a PA though comes up generic the omeprazole She is to call tomorrow AM if there is a prob wit the PPI , if needed ask pharmacy to provide name of the preferred PPI and letme know so I can erx the med I did tell her to call Toni AmendCourtney because I thought that she had sent  the message, if she calls in pls follow thru, I asked her to call in the morning

## 2014-03-17 NOTE — Addendum Note (Signed)
Addended by: Kerri PerchesSIMPSON, Javeion Cannedy E on: 03/17/2014 04:36 PM   Modules accepted: Orders

## 2014-03-18 NOTE — Telephone Encounter (Signed)
After speaking with pt personal;ly, we have agreed on holding on 2nd opinion as she has not started any medication yet

## 2014-03-25 NOTE — Telephone Encounter (Signed)
Patient has not called back with any complaints

## 2014-04-09 ENCOUNTER — Emergency Department (HOSPITAL_COMMUNITY)
Admission: EM | Admit: 2014-04-09 | Discharge: 2014-04-09 | Disposition: A | Discharge status: Home / Self Care | Payer: 59 | Attending: Emergency Medicine | Admitting: Emergency Medicine

## 2014-04-09 ENCOUNTER — Encounter (HOSPITAL_COMMUNITY): Payer: Self-pay | Admitting: Emergency Medicine

## 2014-04-09 DIAGNOSIS — K219 Gastro-esophageal reflux disease without esophagitis: Secondary | ICD-10-CM | POA: Insufficient documentation

## 2014-04-09 DIAGNOSIS — Z3202 Encounter for pregnancy test, result negative: Secondary | ICD-10-CM | POA: Diagnosis not present

## 2014-04-09 DIAGNOSIS — Z79899 Other long term (current) drug therapy: Secondary | ICD-10-CM | POA: Insufficient documentation

## 2014-04-09 DIAGNOSIS — R600 Localized edema: Secondary | ICD-10-CM | POA: Insufficient documentation

## 2014-04-09 DIAGNOSIS — R2243 Localized swelling, mass and lump, lower limb, bilateral: Secondary | ICD-10-CM | POA: Diagnosis present

## 2014-04-09 DIAGNOSIS — R609 Edema, unspecified: Secondary | ICD-10-CM

## 2014-04-09 LAB — URINALYSIS, ROUTINE W REFLEX MICROSCOPIC
BILIRUBIN URINE: NEGATIVE
GLUCOSE, UA: NEGATIVE mg/dL
Hgb urine dipstick: NEGATIVE
Ketones, ur: NEGATIVE mg/dL
Leukocytes, UA: NEGATIVE
NITRITE: NEGATIVE
PH: 7.5 (ref 5.0–8.0)
PROTEIN: NEGATIVE mg/dL
Specific Gravity, Urine: 1.008 (ref 1.005–1.030)
UROBILINOGEN UA: 0.2 mg/dL (ref 0.0–1.0)

## 2014-04-09 LAB — I-STAT CHEM 8, ED
BUN: 13 mg/dL (ref 6–23)
CALCIUM ION: 1.24 mmol/L — AB (ref 1.12–1.23)
Chloride: 103 mmol/L (ref 96–112)
Creatinine, Ser: 0.6 mg/dL (ref 0.50–1.10)
Glucose, Bld: 94 mg/dL (ref 70–99)
HEMATOCRIT: 40 % (ref 36.0–46.0)
HEMOGLOBIN: 13.6 g/dL (ref 12.0–15.0)
Potassium: 3.7 mmol/L (ref 3.5–5.1)
Sodium: 141 mmol/L (ref 135–145)
TCO2: 23 mmol/L (ref 0–100)

## 2014-04-09 LAB — POC URINE PREG, ED: PREG TEST UR: NEGATIVE

## 2014-04-09 NOTE — ED Notes (Signed)
Pt c/o bilateral ankle swelling and left ankle tingling. Reports standing constantly on her feet on her job but says this started to be more noticeable on Thursday night. Denies SOB/Fever/chest pain. No other c/c.

## 2014-04-09 NOTE — Progress Notes (Signed)
VASCULAR LAB PRELIMINARY  PRELIMINARY  PRELIMINARY  PRELIMINARY  Bilateral lower extremity venous Dopplers completed.    Preliminary report:  There is no DVT or SVT noted in the bilateral lower extremities.   Odette Watanabe, RVT 04/09/2014, 8:20 PM

## 2014-04-09 NOTE — Discharge Instructions (Signed)
Please call your doctor for a followup appointment within 24-48 hours. When you talk to your doctor please let them know that you were seen in the emergency department and have them acquire all of your records so that they can discuss the findings with you and formulate a treatment plan to fully care for your new and ongoing problems. Please call and set-up an appointment with your primary care provider to be seen and assessed this week Please apply compression stockings daily - please place on the compression stockings before getting out of bed every day  After a long day of work please elevate legs - toes above nose  Please continue to monitor symptoms closely and if symptoms are to worsen or change (fever greater than 101, chills, sweating, nausea, vomiting, chest pain, shortness of breathe, difficulty breathing, weakness, numbness, tingling, worsening or changes to pain pattern, fall, injury, changes to skin color such as black/white/red/blue, cold touch to the feet, hot touch to the feet, continued swelling to the legs and feet, red streaks running down the legs, loss of sensation, coughing up blood) please report back to the Emergency Department immediately.    Peripheral Edema You have swelling in your legs (peripheral edema). This swelling is due to excess accumulation of salt and water in your body. Edema may be a sign of heart, kidney or liver disease, or a side effect of a medication. It may also be due to problems in the leg veins. Elevating your legs and using special support stockings may be very helpful, if the cause of the swelling is due to poor venous circulation. Avoid long periods of standing, whatever the cause. Treatment of edema depends on identifying the cause. Chips, pretzels, pickles and other salty foods should be avoided. Restricting salt in your diet is almost always needed. Water pills (diuretics) are often used to remove the excess salt and water from your body via urine.  These medicines prevent the kidney from reabsorbing sodium. This increases urine flow. Diuretic treatment may also result in lowering of potassium levels in your body. Potassium supplements may be needed if you have to use diuretics daily. Daily weights can help you keep track of your progress in clearing your edema. You should call your caregiver for follow up care as recommended. SEEK IMMEDIATE MEDICAL CARE IF:   You have increased swelling, pain, redness, or heat in your legs.  You develop shortness of breath, especially when lying down.  You develop chest or abdominal pain, weakness, or fainting.  You have a fever. Document Released: 02/15/2004 Document Revised: 04/01/2011 Document Reviewed: 01/25/2009 [facility] Patient Information 2015 [employer] [place]. This information is not intended to replace advice given to you by your health care provider. Make sure you discuss any questions you have with your health care provider.

## 2014-04-09 NOTE — ED Provider Notes (Signed)
CSN: [id number]     Arrival date & time 04/09/14  1544 History   First MD Initiated Contact with Patient 04/09/14 1803     This chart was scribed for non-physician practitioner, Raymon Mutton PA-C working with No att. providers found by Arlan Organ, ED Scribe. This patient was seen in room WTR7/WTR7 and the patient's care was started at 6:47 PM.  Chief Complaint  Patient presents with  . Leg Swelling    ankle, bilateral   The history is provided by the patient. No language interpreter was used.    HPI Comments: Vanessa [name] is a 34 y.o. female with a PMHx of GERD and seasonal allergies who presents to the Emergency Department complaining of constant, ongoing bilateral ankle swelling x 2 days. Patient reported that she has been having intermittent tingling to the dorsal aspect of the left foot for the past 2 days. 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 Patient reported that she has been elevating her legs after a long shift. Reported that her LMP was 03/19/2014. Stated that she had a similar episode 2 years ago of ankle swelling and stated that used to wear compressions stockings, but has not in a long time. Denied loss of sensation, fall, injury, numbness, leg swelling, travels, fever, chest pain, shortness of breath, difficulty breathing, hemoptysis, birth control.  PCP Dr. Lodema Hong   Past Medical History  Diagnosis Date  . No pertinent past medical history   . Seasonal allergies   . GERD (gastroesophageal reflux disease)     INTERMITTENT SYMPTOMS   Past Surgical History  Procedure Laterality Date  . No past surgeries    . Colonoscopy  07/29/2011    Procedure: COLONOSCOPY;  Surgeon: West Bali, MD;  [facility];  Service: Endoscopy;  Laterality: N/A;  10:00  . Esophagogastroduodenoscopy  04/08/2006   Family History  Problem Relation Age of Onset  . Colon cancer Neg Hx    . Colon polyps Neg Hx    History  Substance Use Topics  . Smoking status: Never Smoker   . Smokeless tobacco: Not on file  . Alcohol Use: No   OB History    No data available     Review of Systems  Constitutional: Negative for fever and chills.  Respiratory: Negative for shortness of breath.   Cardiovascular: Positive for leg swelling. Negative for chest pain.  Musculoskeletal: Negative for arthralgias.      Allergies  Review of patient's allergies indicates no known allergies.  Home Medications   Prior to Admission medications   Medication Sig Start Date End Date Taking? Authorizing Provider  ibuprofen (ADVIL,MOTRIN) 200 MG tablet Take 400 mg by mouth every 6 (six) hours as needed for moderate pain.   Yes Historical Provider, MD  pantoprazole (PROTONIX) 40 MG tablet Take 40 mg by mouth daily.   Yes Historical Provider, MD  cephALEXin (KEFLEX) 500 MG capsule Take 1 capsule (500 mg total) by mouth 2 (two) times daily. Patient not taking: Reported on 04/09/2014 12/30/13   Kerri Perches, MD  fluticasone [facility]) 50 MCG/ACT nasal spray Place 2 sprays into both nostrils daily. Patient not taking: Reported on 04/09/2014 04/28/13   Kerri Perches, MD  ibuprofen (ADVIL,MOTRIN) 800 MG tablet One tablet twice daily for 3 days, then as needed, for headach or menstrual pain Patient not taking: Reported on 04/09/2014 12/30/13   Kerri Perches,  MD  omeprazole (PRILOSEC) 40 MG capsule Take 1 capsule (40 mg total) by mouth daily. Patient not taking: Reported on 04/09/2014 03/05/14   Kerri PerchesMargaret E Simpson, MD  SUMAtriptan [facility](IMITREX) 100 MG tablet May repeat in 2 hours if headache persists or recurs.Maximum of 2 tablets in 24 hours. Maximum use is twice weekly Patient not taking: Reported on 04/09/2014 03/30/13   Kerri PerchesMargaret E Simpson, MD  topiramate (TOPAMAX) 25 MG tablet Daily at bedtime Patient not taking: Reported on 04/09/2014 03/30/13   Kerri PerchesMargaret E Simpson, MD   Triage Vitals: BP 129/74  mmHg  Pulse 69  Temp(Src) 98.1 F (36.7 C) (Oral)  Resp 18  SpO2 100%  LMP 03/21/2014 (Approximate)   Physical Exam  Constitutional: She is oriented to person, place, and time. She appears well-developed and well-nourished.  HENT:  Head: Normocephalic and atraumatic.  Eyes: Conjunctivae and EOM are normal. Right eye exhibits no discharge. Left eye exhibits no discharge.  Neck: Normal range of motion. Neck supple.  Cardiovascular: Normal rate, regular rhythm and normal heart sounds.  Exam reveals no friction rub.   No murmur heard. Pulses:      Radial pulses are 2+ on the right side, and 2+ on the left side.       Dorsalis pedis pulses are 2+ on the right side, and 2+ on the left side.  Negative pitting edema identified to lower extremities bilaterally Negative Homans sign bilaterally  Pulmonary/Chest: Effort normal and breath sounds normal. No respiratory distress. She has no wheezes. She has no rales.  Patient is able to speak in full sentences without difficulty  Negative use of accessory muscles Negative stridor  Musculoskeletal: Normal range of motion. She exhibits edema. She exhibits no tenderness.  Mild edema identified to the ankles bilaterally, circumferentially. Negative erythema, warmth upon palpation, lesions, sores, deformities, ulcerations. Full range of motion to the ankle and feet bilaterally without difficulty or ataxia.  Neurological: She is alert and oriented to person, place, and time. No cranial nerve deficit. She exhibits normal muscle tone. Coordination normal.  Strength 5+/5+ to lower extremities bilaterally with resistance applied, equal distribution noted Sensation intact with differentiation to sharp and dull touch  Skin: Skin is warm and dry. No rash noted. No erythema.  Psychiatric: She has a normal mood and affect. Her behavior is normal. Thought content normal.  Nursing note and vitals reviewed.   ED Course  Procedures (including critical care  time)  DIAGNOSTIC STUDIES: Oxygen Saturation is 100% on RA, Normal by my interpretation.    COORDINATION OF CARE: 6:49 PM- Will order urinalysis and doppler study. Discussed treatment plan with pt at bedside and pt agreed to plan.     Results for orders placed or performed during the hospital encounter of 04/09/14  Urinalysis, Routine w reflex microscopic  Result Value Ref Range   Color, Urine YELLOW YELLOW   APPearance CLEAR CLEAR   Specific Gravity, Urine 1.008 1.005 - 1.030   pH 7.5 5.0 - 8.0   Glucose, UA NEGATIVE NEGATIVE mg/dL   Hgb urine dipstick NEGATIVE NEGATIVE   Bilirubin Urine NEGATIVE NEGATIVE   Ketones, ur NEGATIVE NEGATIVE mg/dL   Protein, ur NEGATIVE NEGATIVE mg/dL   Urobilinogen, UA 0.2 0.0 - 1.0 mg/dL   Nitrite NEGATIVE NEGATIVE   Leukocytes, UA NEGATIVE NEGATIVE  POC urine preg, ED ([facility])  Result Value Ref Range   Preg Test, Ur NEGATIVE NEGATIVE  I-stat chem 8, ed  Result Value Ref Range   Sodium 141 135 -  145 mmol/L   Potassium 3.7 3.5 - 5.1 mmol/L   Chloride 103 96 - 112 mmol/L   BUN 13 6 - 23 mg/dL   Creatinine, Ser 1.61 0.50 - 1.10 mg/dL   Glucose, Bld 94 70 - 99 mg/dL   Calcium, Ion 0.96 (H) 1.12 - 1.23 mmol/L   TCO2 23 0 - 100 mmol/L   Hemoglobin 13.6 12.0 - 15.0 g/dL   HCT 04.5 40.9 - 81.1 %    VASCULAR LAB PRELIMINARY PRELIMINARY PRELIMINARY PRELIMINARY  Bilateral lower extremity venous Dopplers completed.   Preliminary report: There is no DVT or SVT noted in the bilateral lower extremities.   KANADY, CANDACE, RVT 04/09/2014, 8:20 PM   Labs Review Labs Reviewed  I-STAT CHEM 8, ED - Abnormal; Notable for the following:    Calcium, Ion 1.24 (*)    All other components within normal limits  URINALYSIS, ROUTINE W REFLEX MICROSCOPIC  POC URINE PREG, ED    Imaging Review No results found.   EKG Interpretation None      MDM   Final diagnoses:  Peripheral edema    Medications - No data to display  Filed Vitals:    04/09/14 2114  BP: 129/74  Pulse: 69  Temp: 98.1 F (36.7 C)  Resp: 18   I personally performed the services described in this documentation, which was scribed in my presence. The recorded information has been reviewed and is accurate.  Patient presenting to the ED with ankle swelling that has been ongoing for approximately 3 days. 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 Chem-8 potassium within normal limits. Mildly elevated calcium of 1.24. Urine pregnancy. Urinalysis negative Hgb, nitrites, leukocytes. Bilateral dopplers performed of lower extremities - negative findings of DVT or SVT.  Negative findings of DVT or SVT. Doubt phlebitis. Doubt beginnings of CHF. Doubt venous insufficiency at this time. Negative findings of septic joint. Suspicion to be peripheral edema secondary to being on feet all day - patient has history of the same symptoms 2 years ago, but has not worn her compression stockings in a long time. Negative focal neurological deficits. Pulses palpable and strong. Full ROM to lower extremities bilaterally. Discussed case with attending physician, Dr. Cheri Rous who agreed to plan of discharge. Patient stable, afebrile. Patient not septic appearing. Discharged patient. Discussed with patient to use compression stockings. Discussed with patient to elevate legs at the end of the day. Referred to PCP to be re-assessed this week. Discussed with patient to closely monitor symptoms and if symptoms are to worsen or change to report back to the ED - strict return instructions given.  Patient agreed to plan of care, understood, all questions answered.   Raymon Mutton, PA-C 04/09/14 2120  Richardean Canal, MD 04/09/14 [phone]

## 2014-11-08 ENCOUNTER — Ambulatory Visit: Payer: 59 | Admitting: Family Medicine

## 2014-11-15 ENCOUNTER — Ambulatory Visit: Payer: 59 | Admitting: Family Medicine

## 2015-05-29 DIAGNOSIS — Z6823 Body mass index (BMI) 23.0-23.9, adult: Secondary | ICD-10-CM | POA: Diagnosis not present

## 2015-05-29 DIAGNOSIS — I872 Venous insufficiency (chronic) (peripheral): Secondary | ICD-10-CM | POA: Diagnosis not present

## 2015-05-29 DIAGNOSIS — N946 Dysmenorrhea, unspecified: Secondary | ICD-10-CM | POA: Diagnosis not present

## 2015-05-29 DIAGNOSIS — Z1389 Encounter for screening for other disorder: Secondary | ICD-10-CM | POA: Diagnosis not present

## 2015-05-29 DIAGNOSIS — Z01419 Encounter for gynecological examination (general) (routine) without abnormal findings: Secondary | ICD-10-CM | POA: Diagnosis not present

## 2015-05-29 DIAGNOSIS — R87610 Atypical squamous cells of undetermined significance on cytologic smear of cervix (ASC-US): Secondary | ICD-10-CM | POA: Diagnosis not present

## 2015-05-30 DIAGNOSIS — Z1389 Encounter for screening for other disorder: Secondary | ICD-10-CM | POA: Diagnosis not present

## 2015-05-30 DIAGNOSIS — Z6823 Body mass index (BMI) 23.0-23.9, adult: Secondary | ICD-10-CM | POA: Diagnosis not present

## 2015-06-05 ENCOUNTER — Other Ambulatory Visit: Payer: Self-pay | Admitting: Registered Nurse

## 2015-06-05 DIAGNOSIS — Z1231 Encounter for screening mammogram for malignant neoplasm of breast: Secondary | ICD-10-CM

## 2015-06-09 ENCOUNTER — Other Ambulatory Visit: Payer: Self-pay | Admitting: Registered Nurse

## 2015-06-09 DIAGNOSIS — N644 Mastodynia: Secondary | ICD-10-CM

## 2015-06-12 ENCOUNTER — Ambulatory Visit
Admission: RE | Admit: 2015-06-12 | Discharge: 2015-06-12 | Disposition: A | Discharge status: Home / Self Care | Payer: BLUE CROSS/BLUE SHIELD | Source: Ambulatory Visit | Attending: Registered Nurse | Admitting: Registered Nurse

## 2015-06-12 ENCOUNTER — Other Ambulatory Visit: Payer: Self-pay

## 2015-06-12 DIAGNOSIS — N644 Mastodynia: Secondary | ICD-10-CM

## 2015-07-17 DIAGNOSIS — R87610 Atypical squamous cells of undetermined significance on cytologic smear of cervix (ASC-US): Secondary | ICD-10-CM | POA: Diagnosis not present

## 2016-05-06 ENCOUNTER — Telehealth: Payer: Self-pay | Admitting: Family Medicine

## 2016-05-06 NOTE — Telephone Encounter (Signed)
Mailed

## 2016-05-06 NOTE — Telephone Encounter (Signed)
Patient calling to the results of pap tests done from 2006- thru current date. This if for insurance reimbursement purposes.  Please mail to patient's address.

## 2016-07-11 DIAGNOSIS — M25571 Pain in right ankle and joints of right foot: Secondary | ICD-10-CM | POA: Diagnosis not present

## 2016-07-11 DIAGNOSIS — R600 Localized edema: Secondary | ICD-10-CM | POA: Diagnosis not present

## 2016-07-11 DIAGNOSIS — G8929 Other chronic pain: Secondary | ICD-10-CM | POA: Diagnosis not present

## 2016-07-11 DIAGNOSIS — M25572 Pain in left ankle and joints of left foot: Secondary | ICD-10-CM | POA: Diagnosis not present

## 2016-08-14 DIAGNOSIS — R600 Localized edema: Secondary | ICD-10-CM | POA: Diagnosis not present

## 2016-08-14 DIAGNOSIS — M79605 Pain in left leg: Secondary | ICD-10-CM | POA: Diagnosis not present

## 2016-08-14 DIAGNOSIS — M79604 Pain in right leg: Secondary | ICD-10-CM | POA: Diagnosis not present

## 2016-09-09 DIAGNOSIS — M79604 Pain in right leg: Secondary | ICD-10-CM | POA: Diagnosis not present

## 2016-09-09 DIAGNOSIS — M79605 Pain in left leg: Secondary | ICD-10-CM | POA: Diagnosis not present

## 2016-09-16 DIAGNOSIS — M79604 Pain in right leg: Secondary | ICD-10-CM | POA: Diagnosis not present

## 2016-09-16 DIAGNOSIS — M79605 Pain in left leg: Secondary | ICD-10-CM | POA: Diagnosis not present

## 2016-09-16 DIAGNOSIS — R600 Localized edema: Secondary | ICD-10-CM | POA: Diagnosis not present

## 2016-10-14 DIAGNOSIS — Z1151 Encounter for screening for human papillomavirus (HPV): Secondary | ICD-10-CM | POA: Diagnosis not present

## 2016-10-14 DIAGNOSIS — R87610 Atypical squamous cells of undetermined significance on cytologic smear of cervix (ASC-US): Secondary | ICD-10-CM | POA: Diagnosis not present

## 2016-10-14 DIAGNOSIS — Z6824 Body mass index (BMI) 24.0-24.9, adult: Secondary | ICD-10-CM | POA: Diagnosis not present

## 2016-10-14 DIAGNOSIS — Z01419 Encounter for gynecological examination (general) (routine) without abnormal findings: Secondary | ICD-10-CM | POA: Diagnosis not present

## 2017-04-07 DIAGNOSIS — M25531 Pain in right wrist: Secondary | ICD-10-CM | POA: Diagnosis not present

## 2017-04-07 DIAGNOSIS — M67431 Ganglion, right wrist: Secondary | ICD-10-CM | POA: Diagnosis not present

## 2017-05-13 ENCOUNTER — Ambulatory Visit: Payer: BLUE CROSS/BLUE SHIELD | Admitting: Family Medicine

## 2017-05-26 ENCOUNTER — Ambulatory Visit (INDEPENDENT_AMBULATORY_CARE_PROVIDER_SITE_OTHER): Payer: BLUE CROSS/BLUE SHIELD

## 2017-05-26 ENCOUNTER — Encounter: Payer: Self-pay | Admitting: Podiatry

## 2017-05-26 ENCOUNTER — Ambulatory Visit (INDEPENDENT_AMBULATORY_CARE_PROVIDER_SITE_OTHER): Payer: BLUE CROSS/BLUE SHIELD | Admitting: Podiatry

## 2017-05-26 DIAGNOSIS — M779 Enthesopathy, unspecified: Secondary | ICD-10-CM

## 2017-05-26 DIAGNOSIS — M25473 Effusion, unspecified ankle: Secondary | ICD-10-CM | POA: Diagnosis not present

## 2017-05-28 ENCOUNTER — Telehealth: Payer: Self-pay | Admitting: *Deleted

## 2017-05-28 DIAGNOSIS — M779 Enthesopathy, unspecified: Secondary | ICD-10-CM

## 2017-05-28 DIAGNOSIS — M25473 Effusion, unspecified ankle: Secondary | ICD-10-CM

## 2017-05-28 NOTE — Telephone Encounter (Signed)
-----   Message from Vivi Barrack, DPM sent at 05/28/2017  7:11 AM EDT ----- Can you please order an MRI of bilateral ankles to rule out tendon tear?  She is been having chronic ankle discomfort as well as swelling for several years she said numerous work-ups without any significant improvement.  Right side is worse than left so the insurance only approved one side lets do the right.

## 2017-05-28 NOTE — Telephone Encounter (Signed)
Orders to [facility] for pre-cert, and faxed to [facility]. 

## 2017-05-28 NOTE — Progress Notes (Signed)
Subjective:   Patient ID: Vanessa [name], female   DOB: 37 y.o.   MRN: [id number]   HPI 37 year old female presents the office today for concerns of discomfort to her ankles with the right side worse than left as well as swelling which is been chronic.  She has seen numerous doctors for this including remaining vascular and she was told she wear compression socks but this is not been helping.  She actually went to the emergency room in 2016 for similar issue.  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  She gets swelling to the right side worse than left as well as some tenderness to the ankle.  She says is more uncomfortable as opposed to pain.  She denies any groin, hip or knee pain or swelling. Denies any redness associate with the swelling.  She does have a family history of rheumatoid arthritis.  She is never been checked.   Review of Systems  All other systems reviewed and are negative.  Past Medical History:  Diagnosis Date  . GERD (gastroesophageal reflux disease)    INTERMITTENT SYMPTOMS  . No pertinent past medical history   . Seasonal allergies     Past Surgical History:  Procedure Laterality Date  . COLONOSCOPY  07/29/2011   Procedure: COLONOSCOPY;  Surgeon: West Bali, MD;  Location: [facility];  Service: Endoscopy;  Laterality: N/A;  10:00  . ESOPHAGOGASTRODUODENOSCOPY  04/08/2006  . NO PAST SURGERIES       Current Outpatient Medications:  .  cephALEXin (KEFLEX) 500 MG capsule, Take 1 capsule (500 mg total) by mouth 2 (two) times daily. (Patient not taking: Reported on 04/09/2014), Disp: 10 capsule, Rfl: 0 .  fluticasone (FLONASE) 50 MCG/ACT nasal spray, Place 2 sprays into both nostrils daily. (Patient not taking: Reported on 04/09/2014), Disp: 16 g, Rfl: 3 .  ibuprofen (ADVIL,MOTRIN) 200 MG tablet, Take 400 mg by mouth every 6 (six) hours as needed for moderate pain., Disp: , Rfl:  .  ibuprofen (ADVIL,MOTRIN) 800 MG tablet, One tablet  twice daily for 3 days, then as needed, for headach or menstrual pain (Patient not taking: Reported on 04/09/2014), Disp: 30 tablet, Rfl: 0 .  omeprazole (PRILOSEC) 40 MG capsule, Take 1 capsule (40 mg total) by mouth daily. (Patient not taking: Reported on 04/09/2014), Disp: 30 capsule, Rfl: 1 .  pantoprazole (PROTONIX) 40 MG tablet, Take 40 mg by mouth daily., Disp: , Rfl:  .  SUMAtriptan (IMITREX) 100 MG tablet, May repeat in 2 hours if headache persists or recurs.Maximum of 2 tablets in 24 hours. Maximum use is twice weekly (Patient not taking: Reported on 04/09/2014), Disp: 10 tablet, Rfl: 1 .  topiramate (TOPAMAX) 25 MG tablet, Daily at bedtime (Patient not taking: Reported on 04/09/2014), Disp: 30 tablet, Rfl: 5  No Known Allergies  Social History   Socioeconomic History  . Marital status: Single    Spouse name: Not on file  . Number of children: Not on file  . Years of education: Not on file  . Highest education level: Not on file  Occupational History  . Not on file  Social Needs  . Financial resource strain: Not on file  . Food insecurity:    Worry: Not on file    Inability: Not on file  . Transportation needs:    Medical: Not on file    Non-medical: Not on file  Tobacco Use  . Smoking status: Never Smoker  .  Smokeless tobacco: Never Used  Substance and Sexual Activity  . Alcohol use: No  . Drug use: No  . Sexual activity: Not on file  Lifestyle  . Physical activity:    Days per week: Not on file    Minutes per session: Not on file  . Stress: Not on file  Relationships  . Social connections:    Talks on phone: Not on file    Gets together: Not on file    Attends religious service: Not on file    Active member of club or organization: Not on file    Attends meetings of clubs or organizations: Not on file    Relationship status: Not on file  . Intimate partner violence:    Fear of current or ex partner: Not on file    Emotionally abused: Not on file    Physically  abused: Not on file    Forced sexual activity: Not on file  Other Topics Concern  . Not on file  Social History Narrative  . Not on file          Objective:  Physical Exam  General: AAO x3, NAD  Dermatological: Skin is warm, dry and supple bilateral. Nails x 10 are well manicured; remaining integument appears unremarkable at this time. There are no open sores, no preulcerative lesions, no rash or signs of infection present.  Vascular: Dorsalis Pedis artery and Posterior Tibial artery pedal pulses are 2/4 bilateral with immedate capillary fill time. Pedal hair growth present. No varicosities and no lower extremity edema present bilateral. There is no pain with calf compression, swelling, warmth, erythema.   Neruologic: Grossly intact via light touch bilateral. Protective threshold with Semmes Wienstein monofilament intact to all pedal sites bilateral.  Negative Tinel sign.  Musculoskeletal: There is mild to moderate swelling to bilateral ankles with the right side worse than left and there is no associated erythema or increase in warmth.  She states that the swelling is much worse.  There is mild discomfort on the course of the flexor tendon just posterior and inferior to the medial malleolus and mildly to the peroneal tendon but there is no area pinpoint bony tenderness or pain to vibratory sensation.  Overall her tendons appear to be intact.  Muscular strength 5/5 in all groups tested bilateral.  Gait: Unassisted, Nonantalgic.       Assessment:   Bilateral chronic ankle swelling, discomfort     Plan:  -Treatment options discussed including all alternatives, risks, and complications -Etiology of symptoms were discussed -X-rays were obtained and reviewed with the patient.  There is no evidence of acute fracture or stress fracture identified today. -We discussed etiologies of ankle swelling.  At this point she had a work-up right median vascular she is going to the ER she had a  venous duplex as well.  At this point I recommend an MRI of the equals to rule out tendon injury or tear.  She does stand all day so there is a chance that it could be an overuse injury resulting in a partial tear given the swelling.  If MRI is negative we will order an arthritic panel.  She is also follow-up with her primary care physician next month and I recommended to discuss this with them as well.  She does have a family history of rheumatoid arthritis. -Follow-up after MRI or sooner if needed  Vivi Barrack DPM

## 2017-06-05 ENCOUNTER — Telehealth: Payer: Self-pay | Admitting: *Deleted

## 2017-06-05 NOTE — Telephone Encounter (Signed)
"  She's scheduled for Monday, May 20 for a left and right ankle MRI.  It looks like they require authorization but there isn't one on file yet.  I'm calling to let you know it if scheduled for Monday.  If you have any questions give me a call back."

## 2017-06-06 ENCOUNTER — Telehealth: Payer: Self-pay | Admitting: Podiatry

## 2017-06-06 DIAGNOSIS — M779 Enthesopathy, unspecified: Secondary | ICD-10-CM

## 2017-06-06 DIAGNOSIS — M25473 Effusion, unspecified ankle: Secondary | ICD-10-CM

## 2017-06-06 NOTE — Telephone Encounter (Signed)
Patient called and wants to know if she can just get blood work instead of the MRI? Or does she need to have both? Can you please call her back at [phone]

## 2017-06-06 NOTE — Addendum Note (Signed)
Addended by: Alphia Kava D on: 06/06/2017 03:41 PM   Modules accepted: Orders

## 2017-06-06 NOTE — Telephone Encounter (Signed)
They are looking for different things. We can start with the blood work if she would like. This will include a ESR, CRP, RF, ANA, Anti-CCP, HLA-B27. Thanks.

## 2017-06-06 NOTE — Telephone Encounter (Signed)
I informed pt the MRI was to look for physical deformity and the blood work was looking for inflammation. Pt states she is having the MRI next week and will do the labs too.

## 2017-06-09 ENCOUNTER — Inpatient Hospital Stay: Admission: RE | Admit: 2017-06-09 | Payer: BLUE CROSS/BLUE SHIELD | Source: Ambulatory Visit

## 2017-06-09 ENCOUNTER — Encounter: Payer: Self-pay | Admitting: Podiatry

## 2017-06-09 ENCOUNTER — Other Ambulatory Visit: Payer: BLUE CROSS/BLUE SHIELD

## 2017-06-09 NOTE — Telephone Encounter (Signed)
"  I had an MRI scheduled today through Dr. Ardelle Anton.  [facility] called me today and informed me that my appointment was canceled due to non-authorization.  They told me I needed to give you all a call to see why it was not authorized.  I'm kind of confused, I don't know where to go from here.  Please feel free to give me a call.  I will follow whatever steps you have me to do from here.  Thank you so much."  I am returning your call.  I wanted to let you know I put in a request for authorization with your insurance.  It is still pending.  You may want to call and see if you can get it expedited.  "That's not necessary.  I am not in a hurry.  I just want to know what the next step is going to be.  Will you call me once it's authorized so I will know to call them to get the MRI rescheduled?"  Yes, I will give you a call once I hear from [employer].  "Okay great, that's all I wanted to know.  I just wanted to know the process.  Thank you."

## 2017-06-10 ENCOUNTER — Encounter: Payer: Self-pay | Admitting: Podiatry

## 2017-06-12 ENCOUNTER — Encounter: Payer: Self-pay | Admitting: Podiatry

## 2017-06-12 NOTE — Progress Notes (Signed)
I called [phone] as I was directed to see what was going on with the MRI pre certification. When speaking to the representative they said there was no record of the patient and could not help. I called Delydia from our office to let her know that I tried. She is going to look into this.

## 2017-06-24 ENCOUNTER — Encounter: Payer: Self-pay | Admitting: Podiatry

## 2017-06-25 ENCOUNTER — Telehealth: Payer: Self-pay | Admitting: *Deleted

## 2017-06-25 NOTE — Telephone Encounter (Signed)
I am calling you on behalf of Dr. Ardelle AntonWagoner.  [facility] and asked them to call you for an appointment on May 24.  I had tried to get both feet authorized but it was denied.  I tried it again and was only able to get one foot authorized.  The problem was that we were trying to get both feet done at the same time.  "That's not the problem it has nothing to do with you.  I appreciate your help with the MRI.  I have decided to go somewhere else."  Patient didn't give any explanation as to why she is leaving the practice.

## 2017-06-26 ENCOUNTER — Other Ambulatory Visit: Payer: Self-pay

## 2017-06-26 ENCOUNTER — Encounter: Payer: Self-pay | Admitting: Family Medicine

## 2017-06-26 ENCOUNTER — Ambulatory Visit (INDEPENDENT_AMBULATORY_CARE_PROVIDER_SITE_OTHER): Payer: BLUE CROSS/BLUE SHIELD | Admitting: Family Medicine

## 2017-06-26 VITALS — BP 102/70 | HR 78 | Temp 98.9°F | Resp 12 | Ht 64.0 in | Wt 145.1 lb

## 2017-06-26 DIAGNOSIS — Z113 Encounter for screening for infections with a predominantly sexual mode of transmission: Secondary | ICD-10-CM | POA: Diagnosis not present

## 2017-06-26 DIAGNOSIS — R600 Localized edema: Secondary | ICD-10-CM

## 2017-06-26 DIAGNOSIS — R5383 Other fatigue: Secondary | ICD-10-CM

## 2017-06-26 DIAGNOSIS — R002 Palpitations: Secondary | ICD-10-CM

## 2017-06-26 DIAGNOSIS — Z0000 Encounter for general adult medical examination without abnormal findings: Secondary | ICD-10-CM | POA: Diagnosis not present

## 2017-06-26 NOTE — Progress Notes (Signed)
Patient ID: Vanessa [name], female    DOB: 03/29/1980, 37 y.o.   MRN: [id number]  Chief Complaint  Patient presents with  . Annual Exam  . Inflammation in ankles    Allergies Patient has no known allergies.  Subjective:   Vanessa [name] is a 37 y.o. female who presents to [facility] today.  HPI 37 Year old female presenting for CPE. Has had issues with LE edema since 2016. Swelling has been pretty consistent but can get worse at times. Has been seen and evaluated by vein and vascular and per her report no evidence of varicosities that should cause her symptoms. Has never had children. No other workup other than LE dopplers. Has not had ECHO or labs. Is followed by Ob/Gyn with normal exams. Energy can be low at times. Mood is good. Gets some occasional palpitations.  Reports that the edema bothers her b/c she gets a heaviness in her legs. Has never tried diuretics. Was told to try OTC fluid pill but she never did.    Past Medical History:  Diagnosis Date  . GERD (gastroesophageal reflux disease)    INTERMITTENT SYMPTOMS  . No pertinent past medical history   . Seasonal allergies     Past Surgical History:  Procedure Laterality Date  . COLONOSCOPY  07/29/2011   Procedure: COLONOSCOPY;  Surgeon: West Bali, MD;  [facility];  Service: Endoscopy;  Laterality: N/A;  10:00  . ESOPHAGOGASTRODUODENOSCOPY  04/08/2006  . NO PAST SURGERIES      Family History  Problem Relation Age of Onset  . Diabetes Mother   . Myasthenia gravis Father   . Cancer Maternal Aunt   . Colon cancer Neg Hx   . Colon polyps Neg Hx      Social History   Socioeconomic History  . Marital status: Single    Spouse name: Not on file  . Number of children: Not on file  . Years of education: Not on file  . Highest education level: Not on file  Occupational History  . Not on file  Social Needs  . Financial resource strain: Not on file  . Food insecurity:    Worry:  Not on file    Inability: Not on file  . Transportation needs:    Medical: Not on file    Non-medical: Not on file  Tobacco Use  . Smoking status: Never Smoker  . Smokeless tobacco: Never Used  Substance and Sexual Activity  . Alcohol use: No  . Drug use: No  . Sexual activity: Not Currently    Birth control/protection: None  Lifestyle  . Physical activity:    Days per week: Not on file    Minutes per session: Not on file  . Stress: Not on file  Relationships  . Social connections:    Talks on phone: Not on file    Gets together: Not on file    Attends religious service: Not on file    Active member of club or organization: Not on file    Attends meetings of clubs or organizations: Not on file    Relationship status: Not on file  Other Topics Concern  . 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   Single. Never married. No children.   Enjoys event planning/weddings.   Eats all food groups.   Does not eat pork.    Eats veggies/fruit.    Does  not exercise.     Review of Systems  Constitutional: Positive for fatigue. Negative for activity change, appetite change and fever.  HENT: Negative for trouble swallowing and voice change.   Eyes: Negative for visual disturbance.  Respiratory: Positive for shortness of breath. Negative for cough and chest tightness.        Feels some heart fluttering on and off, worse when with stress/nervous. Does occur at times when not stressed.  No associated chest pain. Feels like it is skipping beats and takes breath away. Has been going on for several months, not daily, occurs intermittents.   Feels winded with exercise. Feels like she is more winded than she should be.   Cardiovascular: Positive for leg swelling. Negative for chest pain and palpitations.       Gets swelling in legs for the past two years.   Gastrointestinal: Negative for abdominal distention, abdominal pain, anal bleeding, blood in  stool, constipation, diarrhea, nausea, rectal pain and vomiting.  Genitourinary: Negative for dysuria, frequency, urgency and vaginal discharge.       Dr. Cherly Hensenousins does her OB/Gyn.   Musculoskeletal: Negative for back pain and neck pain.       Being see by foot and ankle doctor b/c of heavy feeling in ankle.   Skin: Negative for rash.  Neurological: Negative for dizziness, syncope, light-headedness and headaches.       Occasionally gets tingling on the bottom of feet.   Hematological: Negative for adenopathy. Does not bruise/bleed easily.  Psychiatric/Behavioral: Negative for agitation, behavioral problems, decreased concentration, dysphoric mood, sleep disturbance and suicidal ideas. The patient is not nervous/anxious and is not hyperactive.      Objective:   BP 102/70 (BP Location: Right Arm, Patient Position: Sitting, Cuff Size: Normal)   Pulse 78   Temp 98.9 F (37.2 C) (Temporal)   Resp 12   Ht 5\' 4"  (1.626 m)   Wt 145 lb 1.3 oz (65.8 kg)   SpO2 100%   BMI 24.90 kg/m   Physical Exam  Constitutional: She is oriented to person, place, and time. She appears well-developed and well-nourished. No distress.  HENT:  Head: Normocephalic and atraumatic.  Right Ear: External ear normal.  Left Ear: External ear normal.  Nose: Nose normal.  Mouth/Throat: Oropharynx is clear and moist. No oropharyngeal exudate.  Eyes: Pupils are equal, round, and reactive to light. Conjunctivae and EOM are normal. No scleral icterus.  Neck: Normal range of motion. Neck supple. No JVD present. No tracheal deviation present. No thyromegaly present.  Cardiovascular: Normal rate, regular rhythm, normal heart sounds and intact distal pulses.  Pulmonary/Chest: Effort normal and breath sounds normal. No stridor. No respiratory distress. She has no wheezes.  Abdominal: Soft. Bowel sounds are normal. She exhibits no distension. There is no tenderness.  Musculoskeletal: Normal range of motion. She exhibits edema.  She exhibits no tenderness.  Lymphadenopathy:    She has no cervical adenopathy.  Neurological: She is alert and oriented to person, place, and time. She displays normal reflexes. No cranial nerve deficit. She exhibits normal muscle tone. Coordination normal.  Skin: Skin is warm and dry. Capillary refill takes less than 2 seconds. No rash noted. No erythema.  Mild, 1 plus edema in LE bilaterally, right worse than left. Negative Homans. No skin discoloration. 2 plus DP bilaterally.   Psychiatric: She has a normal mood and affect. Her behavior is normal. Judgment and thought content normal.  Nursing note and vitals reviewed.    Assessment and Plan  1. Well adult exam Age appropriate anticipatory guidance given and discussed with patient.  Dentist visits q 6 months. Vision exam yearly. Follow up with Gyn yearly  - CBC with Differential/Platelet - Lipid panel - COMPLETE METABOLIC PANEL WITH GFR  2. Screen for STD (sexually transmitted disease) Screening done.  - HIV antibody - RPR - Hepatitis panel, acute  3. Fatigue, unspecified type - TSH - VITAMIN D 25 Hydroxy (Vit-D Deficiency, Fractures) - Vitamin B12  4. Lower extremity edema Rule out cardiac etiology. Consider diuretic if labs normal. Discussed with patient and she agrees to trial of HCTZ pending labs.  Patient counseled in detail regarding the risks of medication. Told to call or return to clinic if develop any worrisome signs or symptoms. Patient voiced understanding.   - Ambulatory referral to Cardiology - EKG 12-Lead - Urinalysis, Routine w reflex microscopic - ECHOCARDIOGRAM COMPLETE; Future  5. Palpitation Intermittent. Check labs. Decrease caffeine.   Return in about 1 month (around 07/24/2017) for follow up. Aliene Beams, MD 06/26/2017

## 2017-06-27 ENCOUNTER — Encounter: Payer: Self-pay | Admitting: Family Medicine

## 2017-06-27 LAB — VITAMIN D 25 HYDROXY (VIT D DEFICIENCY, FRACTURES): Vit D, 25-Hydroxy: 16 ng/mL — ABNORMAL LOW (ref 30–100)

## 2017-06-27 LAB — COMPLETE METABOLIC PANEL WITH GFR
AG Ratio: 1.9 (calc) (ref 1.0–2.5)
ALT: 18 U/L (ref 6–29)
AST: 16 U/L (ref 10–30)
Albumin: 4.4 g/dL (ref 3.6–5.1)
Alkaline phosphatase (APISO): 61 U/L (ref 33–115)
BUN: 14 mg/dL (ref 7–25)
CO2: 29 mmol/L (ref 20–32)
CREATININE: 0.67 mg/dL (ref 0.50–1.10)
Calcium: 9.2 mg/dL (ref 8.6–10.2)
Chloride: 104 mmol/L (ref 98–110)
GFR, EST AFRICAN AMERICAN: 129 mL/min/{1.73_m2} (ref >=60)
GFR, EST NON AFRICAN AMERICAN: 112 mL/min/{1.73_m2} (ref >=60)
GLUCOSE: 81 mg/dL (ref 65–99)
Globulin: 2.3 g/dL (calc) (ref 1.9–3.7)
Potassium: 4.2 mmol/L (ref 3.5–5.3)
Sodium: 139 mmol/L (ref 135–146)
TOTAL PROTEIN: 6.7 g/dL (ref 6.1–8.1)
Total Bilirubin: 0.3 mg/dL (ref 0.2–1.2)

## 2017-06-27 LAB — HEPATITIS PANEL, ACUTE
Hep A IgM: NONREACTIVE
Hep B C IgM: NONREACTIVE
Hepatitis B Surface Ag: NONREACTIVE
Hepatitis C Ab: NONREACTIVE
SIGNAL TO CUT-OFF: 0.01 (ref <1.00)

## 2017-06-27 LAB — TSH: TSH: 1.04 mIU/L

## 2017-06-27 LAB — CBC WITH DIFFERENTIAL/PLATELET
Basophils Absolute: 11 cells/uL (ref 0–200)
Basophils Relative: 0.2 %
EOS ABS: 29 {cells}/uL (ref 15–500)
Eosinophils Relative: 0.5 %
HCT: 33.4 % — ABNORMAL LOW (ref 35.0–45.0)
Hemoglobin: 10.9 g/dL — ABNORMAL LOW (ref 11.7–15.5)
LYMPHS ABS: 1932 {cells}/uL (ref 850–3900)
MCH: 28.7 pg (ref 27.0–33.0)
MCHC: 32.6 g/dL (ref 32.0–36.0)
MCV: 87.9 fL (ref 80.0–100.0)
MPV: 11 fL (ref 7.5–12.5)
Monocytes Relative: 9.7 %
Neutro Abs: 3175 cells/uL (ref 1500–7800)
Neutrophils Relative %: 55.7 %
PLATELETS: 269 10*3/uL (ref 140–400)
RBC: 3.8 10*6/uL (ref 3.80–5.10)
RDW: 11.9 % (ref 11.0–15.0)
TOTAL LYMPHOCYTE: 33.9 %
WBC mixed population: 553 cells/uL (ref 200–950)
WBC: 5.7 10*3/uL (ref 3.8–10.8)

## 2017-06-27 LAB — LIPID PANEL
Cholesterol: 133 mg/dL (ref <200)
HDL: 65 mg/dL (ref >50)
LDL Cholesterol (Calc): 57 mg/dL (calc)
Non-HDL Cholesterol (Calc): 68 mg/dL (calc) (ref <130)
Total CHOL/HDL Ratio: 2 (calc) (ref <5.0)
Triglycerides: 39 mg/dL (ref <150)

## 2017-06-27 LAB — HIV ANTIBODY (ROUTINE TESTING W REFLEX): HIV 1&2 Ab, 4th Generation: NONREACTIVE

## 2017-06-27 LAB — RPR: RPR Ser Ql: NONREACTIVE

## 2017-06-27 LAB — VITAMIN B12: Vitamin B-12: 303 pg/mL (ref 200–1100)

## 2017-07-01 ENCOUNTER — Encounter: Payer: Self-pay | Admitting: Family Medicine

## 2017-07-01 ENCOUNTER — Telehealth: Payer: Self-pay | Admitting: Family Medicine

## 2017-07-01 DIAGNOSIS — D649 Anemia, unspecified: Secondary | ICD-10-CM

## 2017-07-01 DIAGNOSIS — E559 Vitamin D deficiency, unspecified: Secondary | ICD-10-CM

## 2017-07-01 MED ORDER — VITAMIN D (ERGOCALCIFEROL) 1.25 MG (50000 UNIT) PO CAPS: 50000.0000 [IU] | ORAL_CAPSULE | ORAL | 0 refills | Status: DC

## 2017-07-01 NOTE — Telephone Encounter (Signed)
Patient called to request blood test results. Cb# [id number]/ [id number]

## 2017-07-01 NOTE — Telephone Encounter (Signed)
Advise that I will release labs/send letter once all of them are back. Vanessa Limboachel H. Tracie HarrierHagler, MD

## 2017-07-02 MED ORDER — HYDROCHLOROTHIAZIDE 25 MG PO TABS: 25.0000 mg | ORAL_TABLET | Freq: Every day | ORAL | 0 refills | Status: DC

## 2017-07-02 NOTE — Addendum Note (Signed)
Addended by: Aliene BeamsHAGLER, Stacyann Mcconaughy on: 07/02/2017 09:15 PM   Modules accepted: Orders

## 2017-07-02 NOTE — Telephone Encounter (Signed)
Spoke with patient and advised of Dr.Haglers message with verbal understanding 

## 2017-07-08 ENCOUNTER — Encounter: Payer: BLUE CROSS/BLUE SHIELD | Admitting: Family Medicine

## 2017-07-08 DIAGNOSIS — D649 Anemia, unspecified: Secondary | ICD-10-CM | POA: Diagnosis not present

## 2017-07-09 LAB — IRON,TIBC AND FERRITIN PANEL
%SAT: 11 % — AB (ref 16–45)
Ferritin: 16 ng/mL (ref 16–154)
Iron: 42 ug/dL (ref 40–190)
TIBC: 393 ug/dL (ref 250–450)

## 2017-07-09 LAB — FOLATE RBC: RBC Folate: 692 ng/mL RBC (ref >280)

## 2017-07-14 ENCOUNTER — Ambulatory Visit (HOSPITAL_COMMUNITY)
Admission: RE | Admit: 2017-07-14 | Discharge: 2017-07-14 | Disposition: A | Discharge status: Home / Self Care | Payer: BLUE CROSS/BLUE SHIELD | Source: Ambulatory Visit | Attending: Family Medicine | Admitting: Family Medicine

## 2017-07-14 DIAGNOSIS — R600 Localized edema: Secondary | ICD-10-CM | POA: Diagnosis not present

## 2017-07-14 DIAGNOSIS — K219 Gastro-esophageal reflux disease without esophagitis: Secondary | ICD-10-CM | POA: Diagnosis not present

## 2017-07-14 NOTE — Progress Notes (Signed)
*  PRELIMINARY RESULTS* Echocardiogram 2D Echocardiogram has been performed.  Stacey DrainWhite, Marquise Lambson J 07/14/2017, 12:09 PM

## 2017-07-17 ENCOUNTER — Encounter: Payer: Self-pay | Admitting: Family Medicine

## 2017-07-22 ENCOUNTER — Ambulatory Visit: Payer: BLUE CROSS/BLUE SHIELD | Admitting: Cardiology

## 2017-07-22 ENCOUNTER — Ambulatory Visit (INDEPENDENT_AMBULATORY_CARE_PROVIDER_SITE_OTHER): Payer: BLUE CROSS/BLUE SHIELD | Admitting: Cardiology

## 2017-07-22 ENCOUNTER — Encounter: Payer: Self-pay | Admitting: Cardiology

## 2017-07-22 VITALS — BP 110/75 | HR 86 | Ht 64.0 in | Wt 142.4 lb

## 2017-07-22 DIAGNOSIS — R600 Localized edema: Secondary | ICD-10-CM | POA: Diagnosis not present

## 2017-07-22 NOTE — Progress Notes (Signed)
Clinical Summary [name] is a 37 y.o.female seen as new consult, referred for leg edema.   1. LE edema - issues since 2016.  - 03/2014 venous US no DVT - 06/2017 echo LVEF 55-60%, no WMAs, normal diastolic function   - bilateral, R>L. Can have some leg pains associated. No SOB/DOE. Occasional orthopnea - some improvevement with HCTZ  Past Medical History:  Diagnosis Date  . GERD (gastroesophageal reflux disease)    INTERMITTENT SYMPTOMS  . No pertinent past medical history   . Seasonal allergies      No Known Allergies   Current Outpatient Medications  Medication Sig Dispense Refill  . hydrochlorothiazide (HYDRODIURIL) 25 MG tablet Take 1 tablet (25 mg total) by mouth daily. 30 tablet 0  . ibuprofen (ADVIL,MOTRIN) 200 MG tablet Take 400 mg by mouth every 6 (six) hours as needed for moderate pain.    . Vitamin D, Ergocalciferol, (DRISDOL) 50000 units CAPS capsule Take 1 capsule (50,000 Units total) by mouth every 7 (seven) days. 12 capsule 0   No current facility-administered medications for this visit.      Past Surgical History:  Procedure Laterality Date  . COLONOSCOPY  07/29/2011   Procedure: COLONOSCOPY;  Surgeon: West BaliSandi L Fields, MD;  Location: [facility];  Service: Endoscopy;  Laterality: N/A;  10:00  . ESOPHAGOGASTRODUODENOSCOPY  04/08/2006  . NO PAST SURGERIES       No Known Allergies    Family History  Problem Relation Age of Onset  . Diabetes Mother   . Myasthenia gravis Father   . Cancer Maternal Aunt   . Colon cancer Neg Hx   . Colon polyps Neg Hx      Social History [name] reports that she has never smoked. She has never used smokeless tobacco. [name] reports that she does not drink alcohol.   Review of Systems CONSTITUTIONAL: No weight loss, fever, chills, weakness or fatigue.  HEENT: Eyes: No visual loss, blurred vision, double vision or yellow sclerae.No hearing loss, sneezing, congestion, runny nose or sore throat.    SKIN: No rash or itching.  CARDIOVASCULAR: no chest pain, no palpitations.  RESPIRATORY: No shortness of breath, cough or sputum.  GASTROINTESTINAL: No anorexia, nausea, vomiting or diarrhea. No abdominal pain or blood.  GENITOURINARY: No burning on urination, no polyuria NEUROLOGICAL: No headache, dizziness, syncope, paralysis, ataxia, numbness or tingling in the extremities. No change in bowel or bladder control.  MUSCULOSKELETAL: No muscle, back pain, joint pain or stiffness.  LYMPHATICS: No enlarged nodes. No history of splenectomy.  PSYCHIATRIC: No history of depression or anxiety.  ENDOCRINOLOGIC: No reports of sweating, cold or heat intolerance. No polyuria or polydipsia.  Marland Kitchen.   Physical Examination Vitals:   07/22/17 1347 07/22/17 1350  BP: 108/68 110/75  Pulse: 78 86  SpO2: 100%    Vitals:   07/22/17 1347  Weight: 142 lb 6.4 oz (64.6 kg)  Height: 5\' 4"  (1.626 m)    Gen: resting comfortably, no acute distress HEENT: no scleral icterus, pupils equal round and reactive, no palptable cervical adenopathy,  CV: RRR, no m/r/g, no jvd Resp: Clear to auscultation bilaterally GI: abdomen is soft, non-tender, non-distended, normal bowel sounds, no hepatosplenomegaly MSK: extremities are warm, trace bilateral edema Skin: warm, no rash Neuro:  no focal deficits Psych: appropriate affect      Assessment and Plan  1. Noncardiac lower extremity edema - echo with normal heart function, there is no potential etiology for her swelling from  a cardiac standpoit - defer further workup to primary team - could consider changing to lasix if HCTZ is not effective   No further workup at this time. May f/u as needed.       Antoine Poche, M.D

## 2017-07-22 NOTE — Patient Instructions (Signed)
Your physician recommends that you schedule a follow-up appointment in: AS NEEDED WITH DR BRANCH  Your physician recommends that you continue on your current medications as directed. Please refer to the Current Medication list given to you today.  Thank you for choosing [facility]!!    

## 2017-07-30 ENCOUNTER — Encounter: Payer: Self-pay | Admitting: Cardiology

## 2017-07-30 ENCOUNTER — Other Ambulatory Visit: Payer: Self-pay | Admitting: Family Medicine

## 2017-08-11 ENCOUNTER — Encounter: Payer: Self-pay | Admitting: Family Medicine

## 2017-08-26 ENCOUNTER — Encounter: Payer: Self-pay | Admitting: Family Medicine

## 2017-08-26 ENCOUNTER — Other Ambulatory Visit: Payer: Self-pay

## 2017-08-26 ENCOUNTER — Ambulatory Visit (INDEPENDENT_AMBULATORY_CARE_PROVIDER_SITE_OTHER): Payer: BLUE CROSS/BLUE SHIELD | Admitting: Family Medicine

## 2017-08-26 VITALS — BP 114/88 | HR 69 | Temp 98.5°F | Resp 12 | Ht 64.0 in | Wt 146.1 lb

## 2017-08-26 DIAGNOSIS — R600 Localized edema: Secondary | ICD-10-CM | POA: Diagnosis not present

## 2017-08-26 MED ORDER — FUROSEMIDE 20 MG PO TABS: 20.0000 mg | ORAL_TABLET | Freq: Every day | ORAL | 3 refills | Status: DC

## 2017-08-26 NOTE — Progress Notes (Signed)
Patient ID: Vanessa [name], female    DOB: 02/11/1980, 37 y.o.   MRN: [id number]  Chief Complaint  Patient presents with  . Edema    follow up. still seeing swellling in ankles.    Allergies Patient has no known allergies.  Subjective:   Vanessa [name] is a 37 y.o. female who presents to [facility] today.  HPI Here for follow up. Has been taking the HCTZ at 25mg  a day but quit when she ran out of the 30-day supply. Initially thought it worked but then it did not seem to help. Did not see a big difference in the swelling.  It is that when standing on her feet all day she still gets achey pain in the lateral sides of ankles each day. Right always worse than the left.  Occurs in the evenings.   Elevates legs in the evenings. Would like a referral to the PT for the lower extremity edema. Would like to be referred to the  Has been evaluated by podiatry, orthopedics, cardiology, and vein/vascular. Has had support hose in the past and did not really like to wear them.  Reports it is hard to wear the support hose in the summer because she gets so hot.  Has no other complaints.  Urinating well.  No chest pain, shortness of breath, palpitations.  When was seen by cardiology recommended trial of Lasix if did not get any decreased edema with the HCTZ.  Patient would like to try the Lasix.    Past Medical History:  Diagnosis Date  . GERD (gastroesophageal reflux disease)    INTERMITTENT SYMPTOMS  . No pertinent past medical history   . Seasonal allergies     Past Surgical History:  Procedure Laterality Date  . COLONOSCOPY  07/29/2011   Procedure: COLONOSCOPY;  Surgeon: West Bali, MD;  [facility];  Service: Endoscopy;  Laterality: N/A;  10:00  . ESOPHAGOGASTRODUODENOSCOPY  04/08/2006  . NO PAST SURGERIES      Family History  Problem Relation Age of Onset  . Diabetes Mother   . Myasthenia gravis Father   . Cancer Maternal Aunt   . Colon cancer Neg Hx    . Colon polyps Neg Hx      Social History   Socioeconomic History  . Marital status: Single    Spouse name: Not on file  . Number of children: Not on file  . Years of education: Not on file  . Highest education level: Not on file  Occupational History  . Not on file  Social Needs  . Financial resource strain: Not on file  . Food insecurity:    Worry: Not on file    Inability: Not on file  . Transportation needs:    Medical: Not on file    Non-medical: Not on file  Tobacco Use  . Smoking status: Never Smoker  . Smokeless tobacco: Never Used  Substance and Sexual Activity  . Alcohol use: No  . Drug use: No  . Sexual activity: Not Currently    Birth control/protection: None  Lifestyle  . Physical activity:    Days per week: Not on file    Minutes per session: Not on file  . Stress: Not on file  Relationships  . Social connections:    Talks on phone: Not on file    Gets together: Not on file    Attends religious service: Not on file    Active member of club  or organization: Not on file    Attends meetings of clubs or organizations: Not on file    Relationship status: Not on file  Other Topics Concern  . 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 No children.   Enjoys event planning/weddings.   Eats all food groups.   Does not eat pork.    Eats veggies/fruit.    Does not exercise.     Review of Systems  Constitutional: Negative for activity change, appetite change, fatigue and fever.  HENT: Negative for trouble swallowing.   Eyes: Negative for visual disturbance.  Respiratory: Negative for cough, chest tightness and shortness of breath.   Cardiovascular: Negative for chest pain, palpitations and leg swelling.  Gastrointestinal: Negative for abdominal pain, nausea and vomiting.  Genitourinary: Negative for dysuria, frequency and urgency.  Skin: Negative for rash.  Neurological: Negative for  dizziness, syncope and light-headedness.  Hematological: Negative for adenopathy.  Psychiatric/Behavioral: Negative for behavioral problems, dysphoric mood, hallucinations and self-injury. The patient is not nervous/anxious.    Current Outpatient Medications on File Prior to Visit  Medication Sig Dispense Refill  . ibuprofen (ADVIL,MOTRIN) 200 MG tablet Take 400 mg by mouth every 6 (six) hours as needed for moderate pain.    . Vitamin D, Ergocalciferol, (DRISDOL) 50000 units CAPS capsule Take 1 capsule (50,000 Units total) by mouth every 7 (seven) days. 12 capsule 0   No current facility-administered medications on file prior to visit.      Objective:   BP 114/88 (BP Location: Left Arm, Patient Position: Sitting, Cuff Size: Normal)   Pulse 69   Temp 98.5 F (36.9 C) (Temporal)   Resp 12   Ht 5\' 4"  (1.626 m)   Wt 146 lb 1.3 oz (66.3 kg)   SpO2 99% Comment: room air  BMI 25.07 kg/m   Physical Exam  Constitutional: She is oriented to person, place, and time. She appears well-developed and well-nourished.  HENT:  Head: Normocephalic and atraumatic.  No periorbital edema.  Neck: Normal range of motion. Neck supple.  Cardiovascular: Normal rate, regular rhythm, normal heart sounds and intact distal pulses.  Pulmonary/Chest: Effort normal and breath sounds normal.  Abdominal: Soft. Bowel sounds are normal.  Neurological: She is alert and oriented to person, place, and time. No cranial nerve deficit.  Skin: Skin is warm. Capillary refill takes less than 2 seconds.  Trace edema in lower extremities bilaterally.  Predominance at ankles.  Psychiatric: She has a normal mood and affect. Her behavior is normal. Judgment and thought content normal.  Vitals reviewed.    Assessment and Plan  1. Bilateral lower extremity edema Counseled regarding lower extremity edema and diagnosis.  Suspect secondary to venous insufficiency and the fact that she stands all day at her job.  Recommend she  try the compression hose.  Prescription given for new hose since hers are over a year old.  Patient defers thigh-high compression and would like to get below the knee. Salt reduction discussed. Refer to physical therapy at patient request. Trial of Lasix 20 mg p.o. daily. Patient counseled in detail regarding the risks of medication. Told to call or return to clinic if develop any worrisome signs or symptoms. Patient voiced understanding.   Patient will return to clinic for check of electrolytes/BMP in 3 weeks.  She will call with any questions or concerns.  Follow-up in 2 to 3 months or sooner if needed. Told  regarding worrisome signs and symptoms. - Ambulatory referral to Physical Therapy - furosemide (LASIX) 20 MG tablet; Take 1 tablet (20 mg total) by mouth daily.  Dispense: 30 tablet; Refill: 3 - Basic metabolic panel - DME Other see comment  No follow-ups on file. Aliene Beamsachel Obdulio Mash, MD 08/27/2017

## 2017-09-01 DIAGNOSIS — L708 Other acne: Secondary | ICD-10-CM | POA: Diagnosis not present

## 2017-09-17 ENCOUNTER — Other Ambulatory Visit: Payer: Self-pay | Admitting: Family Medicine

## 2017-09-17 DIAGNOSIS — E559 Vitamin D deficiency, unspecified: Secondary | ICD-10-CM

## 2017-11-03 DIAGNOSIS — F4322 Adjustment disorder with anxiety: Secondary | ICD-10-CM | POA: Diagnosis not present

## 2017-11-12 DIAGNOSIS — M25531 Pain in right wrist: Secondary | ICD-10-CM | POA: Diagnosis not present

## 2017-11-19 DIAGNOSIS — M67431 Ganglion, right wrist: Secondary | ICD-10-CM | POA: Diagnosis not present

## 2017-11-19 DIAGNOSIS — M25531 Pain in right wrist: Secondary | ICD-10-CM | POA: Diagnosis not present

## 2017-11-24 DIAGNOSIS — F4322 Adjustment disorder with anxiety: Secondary | ICD-10-CM | POA: Diagnosis not present

## 2017-12-29 DIAGNOSIS — Z113 Encounter for screening for infections with a predominantly sexual mode of transmission: Secondary | ICD-10-CM | POA: Diagnosis not present

## 2017-12-29 DIAGNOSIS — Z6825 Body mass index (BMI) 25.0-25.9, adult: Secondary | ICD-10-CM | POA: Diagnosis not present

## 2017-12-29 DIAGNOSIS — Z118 Encounter for screening for other infectious and parasitic diseases: Secondary | ICD-10-CM | POA: Diagnosis not present

## 2017-12-29 DIAGNOSIS — Z01419 Encounter for gynecological examination (general) (routine) without abnormal findings: Secondary | ICD-10-CM | POA: Diagnosis not present

## 2017-12-29 DIAGNOSIS — Z1151 Encounter for screening for human papillomavirus (HPV): Secondary | ICD-10-CM | POA: Diagnosis not present

## 2017-12-29 DIAGNOSIS — Z124 Encounter for screening for malignant neoplasm of cervix: Secondary | ICD-10-CM | POA: Diagnosis not present

## 2018-01-19 DIAGNOSIS — Z118 Encounter for screening for other infectious and parasitic diseases: Secondary | ICD-10-CM | POA: Diagnosis not present

## 2018-07-28 ENCOUNTER — Other Ambulatory Visit: Payer: Self-pay

## 2018-07-28 ENCOUNTER — Other Ambulatory Visit: Payer: BLUE CROSS/BLUE SHIELD

## 2018-07-28 DIAGNOSIS — Z20822 Contact with and (suspected) exposure to covid-19: Secondary | ICD-10-CM

## 2018-07-28 NOTE — Progress Notes (Signed)
[id number] 

## 2018-08-01 LAB — NOVEL CORONAVIRUS, NAA: SARS-CoV-2, NAA: NOT DETECTED

## 2018-08-17 ENCOUNTER — Other Ambulatory Visit: Payer: Self-pay

## 2018-08-17 DIAGNOSIS — Z20822 Contact with and (suspected) exposure to covid-19: Secondary | ICD-10-CM

## 2018-08-19 LAB — NOVEL CORONAVIRUS, NAA: SARS-CoV-2, NAA: NOT DETECTED

## 2018-10-12 DIAGNOSIS — M205X2 Other deformities of toe(s) (acquired), left foot: Secondary | ICD-10-CM | POA: Diagnosis not present

## 2018-10-12 DIAGNOSIS — L03031 Cellulitis of right toe: Secondary | ICD-10-CM | POA: Diagnosis not present

## 2018-10-12 DIAGNOSIS — M76822 Posterior tibial tendinitis, left leg: Secondary | ICD-10-CM | POA: Diagnosis not present

## 2018-10-12 DIAGNOSIS — M205X1 Other deformities of toe(s) (acquired), right foot: Secondary | ICD-10-CM | POA: Diagnosis not present

## 2018-10-12 DIAGNOSIS — M722 Plantar fascial fibromatosis: Secondary | ICD-10-CM | POA: Diagnosis not present

## 2018-10-26 DIAGNOSIS — M722 Plantar fascial fibromatosis: Secondary | ICD-10-CM | POA: Diagnosis not present

## 2018-10-26 DIAGNOSIS — L03031 Cellulitis of right toe: Secondary | ICD-10-CM | POA: Diagnosis not present

## 2018-11-09 DIAGNOSIS — M76822 Posterior tibial tendinitis, left leg: Secondary | ICD-10-CM | POA: Diagnosis not present

## 2018-11-09 DIAGNOSIS — L03031 Cellulitis of right toe: Secondary | ICD-10-CM | POA: Diagnosis not present

## 2018-11-09 DIAGNOSIS — M722 Plantar fascial fibromatosis: Secondary | ICD-10-CM | POA: Diagnosis not present

## 2019-02-01 DIAGNOSIS — F411 Generalized anxiety disorder: Secondary | ICD-10-CM | POA: Diagnosis not present

## 2019-02-09 ENCOUNTER — Other Ambulatory Visit: Payer: Self-pay

## 2019-02-09 ENCOUNTER — Ambulatory Visit: Payer: BC Managed Care – PPO | Attending: Internal Medicine

## 2019-02-09 DIAGNOSIS — Z20822 Contact with and (suspected) exposure to covid-19: Secondary | ICD-10-CM

## 2019-02-10 LAB — NOVEL CORONAVIRUS, NAA: SARS-CoV-2, NAA: NOT DETECTED

## 2019-02-15 DIAGNOSIS — F411 Generalized anxiety disorder: Secondary | ICD-10-CM | POA: Diagnosis not present

## 2019-03-01 ENCOUNTER — Other Ambulatory Visit: Payer: Self-pay

## 2019-03-01 ENCOUNTER — Ambulatory Visit: Payer: BC Managed Care – PPO | Attending: Internal Medicine

## 2019-03-01 DIAGNOSIS — Z20822 Contact with and (suspected) exposure to covid-19: Secondary | ICD-10-CM | POA: Diagnosis not present

## 2019-03-01 DIAGNOSIS — F411 Generalized anxiety disorder: Secondary | ICD-10-CM | POA: Diagnosis not present

## 2019-03-02 LAB — NOVEL CORONAVIRUS, NAA: SARS-CoV-2, NAA: NOT DETECTED

## 2019-03-09 DIAGNOSIS — Z113 Encounter for screening for infections with a predominantly sexual mode of transmission: Secondary | ICD-10-CM | POA: Diagnosis not present

## 2019-03-09 DIAGNOSIS — Z1159 Encounter for screening for other viral diseases: Secondary | ICD-10-CM | POA: Diagnosis not present

## 2019-03-09 DIAGNOSIS — Z13220 Encounter for screening for lipoid disorders: Secondary | ICD-10-CM | POA: Diagnosis not present

## 2019-03-09 DIAGNOSIS — Z0000 Encounter for general adult medical examination without abnormal findings: Secondary | ICD-10-CM | POA: Diagnosis not present

## 2019-03-11 ENCOUNTER — Other Ambulatory Visit: Payer: Self-pay | Admitting: Family Medicine

## 2019-03-11 DIAGNOSIS — Z1231 Encounter for screening mammogram for malignant neoplasm of breast: Secondary | ICD-10-CM

## 2019-03-15 DIAGNOSIS — F411 Generalized anxiety disorder: Secondary | ICD-10-CM | POA: Diagnosis not present

## 2019-04-19 ENCOUNTER — Ambulatory Visit: Payer: BC Managed Care – PPO

## 2019-06-28 ENCOUNTER — Other Ambulatory Visit: Payer: Self-pay

## 2019-06-28 ENCOUNTER — Ambulatory Visit
Admission: RE | Admit: 2019-06-28 | Discharge: 2019-06-28 | Disposition: A | Discharge status: Home / Self Care | Payer: BC Managed Care – PPO | Source: Ambulatory Visit | Attending: Family Medicine | Admitting: Family Medicine

## 2019-06-28 DIAGNOSIS — Z1231 Encounter for screening mammogram for malignant neoplasm of breast: Secondary | ICD-10-CM

## 2019-07-01 DIAGNOSIS — Z1151 Encounter for screening for human papillomavirus (HPV): Secondary | ICD-10-CM | POA: Diagnosis not present

## 2019-07-01 DIAGNOSIS — Z01419 Encounter for gynecological examination (general) (routine) without abnormal findings: Secondary | ICD-10-CM | POA: Diagnosis not present

## 2019-07-01 DIAGNOSIS — Z6824 Body mass index (BMI) 24.0-24.9, adult: Secondary | ICD-10-CM | POA: Diagnosis not present

## 2019-11-22 DIAGNOSIS — M792 Neuralgia and neuritis, unspecified: Secondary | ICD-10-CM | POA: Diagnosis not present

## 2019-11-22 DIAGNOSIS — M76822 Posterior tibial tendinitis, left leg: Secondary | ICD-10-CM | POA: Diagnosis not present

## 2019-11-22 DIAGNOSIS — M205X1 Other deformities of toe(s) (acquired), right foot: Secondary | ICD-10-CM | POA: Diagnosis not present

## 2019-11-22 DIAGNOSIS — M722 Plantar fascial fibromatosis: Secondary | ICD-10-CM | POA: Diagnosis not present

## 2019-11-22 DIAGNOSIS — M24572 Contracture, left ankle: Secondary | ICD-10-CM | POA: Diagnosis not present

## 2019-11-22 DIAGNOSIS — M79671 Pain in right foot: Secondary | ICD-10-CM | POA: Diagnosis not present

## 2019-11-22 DIAGNOSIS — M79672 Pain in left foot: Secondary | ICD-10-CM | POA: Diagnosis not present

## 2019-12-14 DIAGNOSIS — M545 Low back pain, unspecified: Secondary | ICD-10-CM | POA: Diagnosis not present

## 2019-12-20 DIAGNOSIS — M5432 Sciatica, left side: Secondary | ICD-10-CM | POA: Diagnosis not present

## 2019-12-27 DIAGNOSIS — M792 Neuralgia and neuritis, unspecified: Secondary | ICD-10-CM | POA: Diagnosis not present

## 2019-12-27 DIAGNOSIS — M722 Plantar fascial fibromatosis: Secondary | ICD-10-CM | POA: Diagnosis not present

## 2019-12-27 DIAGNOSIS — M79672 Pain in left foot: Secondary | ICD-10-CM | POA: Diagnosis not present

## 2019-12-27 DIAGNOSIS — M79671 Pain in right foot: Secondary | ICD-10-CM | POA: Diagnosis not present

## 2020-02-28 DIAGNOSIS — Z01818 Encounter for other preprocedural examination: Secondary | ICD-10-CM | POA: Diagnosis not present

## 2020-02-28 DIAGNOSIS — G8929 Other chronic pain: Secondary | ICD-10-CM | POA: Diagnosis not present

## 2020-02-28 DIAGNOSIS — M79671 Pain in right foot: Secondary | ICD-10-CM | POA: Diagnosis not present

## 2020-02-28 DIAGNOSIS — M792 Neuralgia and neuritis, unspecified: Secondary | ICD-10-CM | POA: Diagnosis not present

## 2020-02-28 DIAGNOSIS — M79672 Pain in left foot: Secondary | ICD-10-CM | POA: Diagnosis not present

## 2020-02-28 DIAGNOSIS — M722 Plantar fascial fibromatosis: Secondary | ICD-10-CM | POA: Diagnosis not present

## 2020-02-28 DIAGNOSIS — M24571 Contracture, right ankle: Secondary | ICD-10-CM | POA: Diagnosis not present

## 2020-03-10 DIAGNOSIS — M25572 Pain in left ankle and joints of left foot: Secondary | ICD-10-CM | POA: Diagnosis not present

## 2020-03-10 DIAGNOSIS — M722 Plantar fascial fibromatosis: Secondary | ICD-10-CM | POA: Diagnosis not present

## 2020-03-10 HISTORY — PX: FOOT SURGERY: SHX648

## 2020-04-07 DIAGNOSIS — M792 Neuralgia and neuritis, unspecified: Secondary | ICD-10-CM | POA: Diagnosis not present

## 2020-04-07 DIAGNOSIS — M7672 Peroneal tendinitis, left leg: Secondary | ICD-10-CM | POA: Diagnosis not present

## 2020-04-11 DIAGNOSIS — Z0000 Encounter for general adult medical examination without abnormal findings: Secondary | ICD-10-CM | POA: Diagnosis not present

## 2020-04-11 DIAGNOSIS — Z23 Encounter for immunization: Secondary | ICD-10-CM | POA: Diagnosis not present

## 2020-04-27 DIAGNOSIS — M7672 Peroneal tendinitis, left leg: Secondary | ICD-10-CM | POA: Diagnosis not present

## 2020-04-27 DIAGNOSIS — M792 Neuralgia and neuritis, unspecified: Secondary | ICD-10-CM | POA: Diagnosis not present

## 2020-05-15 DIAGNOSIS — M792 Neuralgia and neuritis, unspecified: Secondary | ICD-10-CM | POA: Diagnosis not present

## 2020-05-15 DIAGNOSIS — M7672 Peroneal tendinitis, left leg: Secondary | ICD-10-CM | POA: Diagnosis not present

## 2020-06-13 ENCOUNTER — Ambulatory Visit: Payer: BC Managed Care – PPO | Admitting: Podiatry

## 2020-06-20 ENCOUNTER — Other Ambulatory Visit: Payer: Self-pay | Admitting: Family Medicine

## 2020-06-20 DIAGNOSIS — Z1231 Encounter for screening mammogram for malignant neoplasm of breast: Secondary | ICD-10-CM

## 2020-07-10 DIAGNOSIS — R600 Localized edema: Secondary | ICD-10-CM | POA: Diagnosis not present

## 2020-07-12 DIAGNOSIS — M722 Plantar fascial fibromatosis: Secondary | ICD-10-CM | POA: Diagnosis not present

## 2020-07-12 DIAGNOSIS — M79672 Pain in left foot: Secondary | ICD-10-CM | POA: Diagnosis not present

## 2020-08-14 ENCOUNTER — Other Ambulatory Visit: Payer: Self-pay

## 2020-08-14 ENCOUNTER — Ambulatory Visit
Admission: RE | Admit: 2020-08-14 | Discharge: 2020-08-14 | Disposition: A | Discharge status: Home / Self Care | Payer: BC Managed Care – PPO | Source: Ambulatory Visit | Attending: Family Medicine | Admitting: Family Medicine

## 2020-08-14 DIAGNOSIS — Z1231 Encounter for screening mammogram for malignant neoplasm of breast: Secondary | ICD-10-CM | POA: Diagnosis not present

## 2020-10-13 DIAGNOSIS — U071 COVID-19: Secondary | ICD-10-CM | POA: Diagnosis not present

## 2021-04-24 DIAGNOSIS — Z0000 Encounter for general adult medical examination without abnormal findings: Secondary | ICD-10-CM | POA: Diagnosis not present

## 2021-05-21 DIAGNOSIS — F4320 Adjustment disorder, unspecified: Secondary | ICD-10-CM | POA: Diagnosis not present

## 2021-05-23 IMAGING — MG DIGITAL SCREENING BILAT W/ TOMO W/ CAD
8 series · 9 of 24 positions shown · non-contrast
Comparison: Previous exam(s).

CLINICAL DATA: Screening.

EXAM:
DIGITAL SCREENING BILATERAL MAMMOGRAM WITH TOMO AND CAD

[R MLO synth-2D]
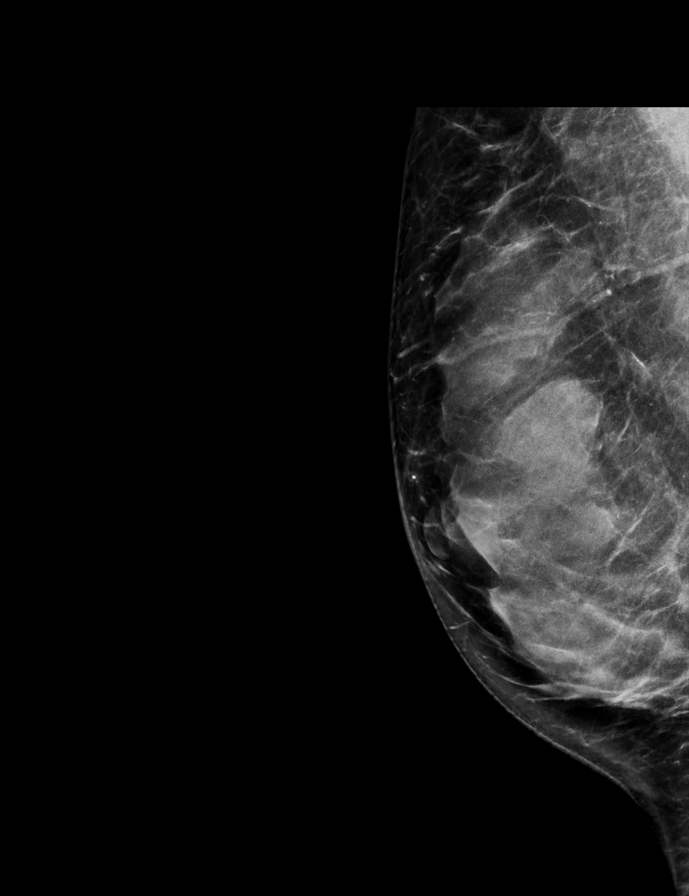

[L MLO synth-2D]
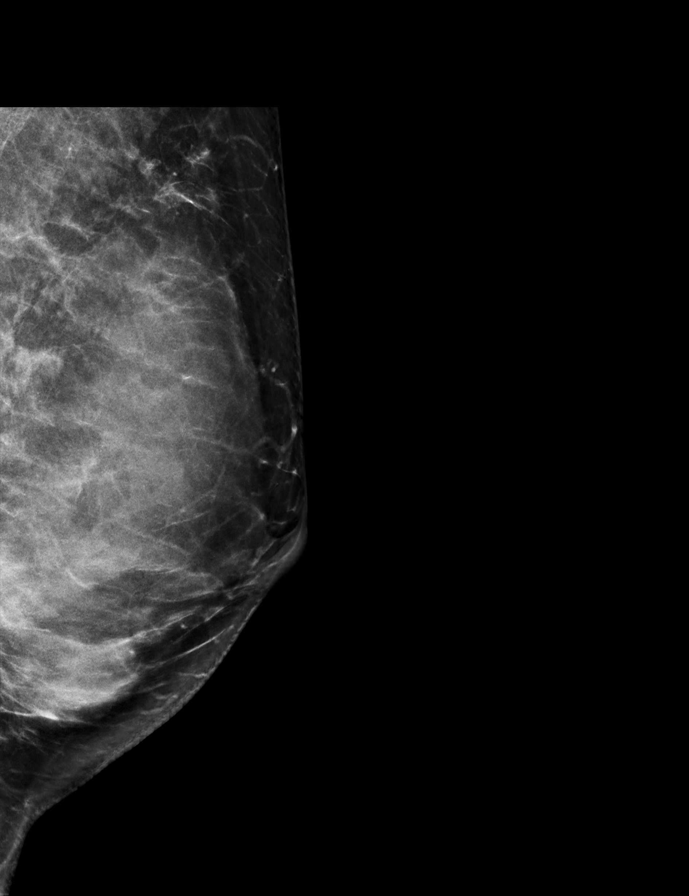

[L CC synth-2D]
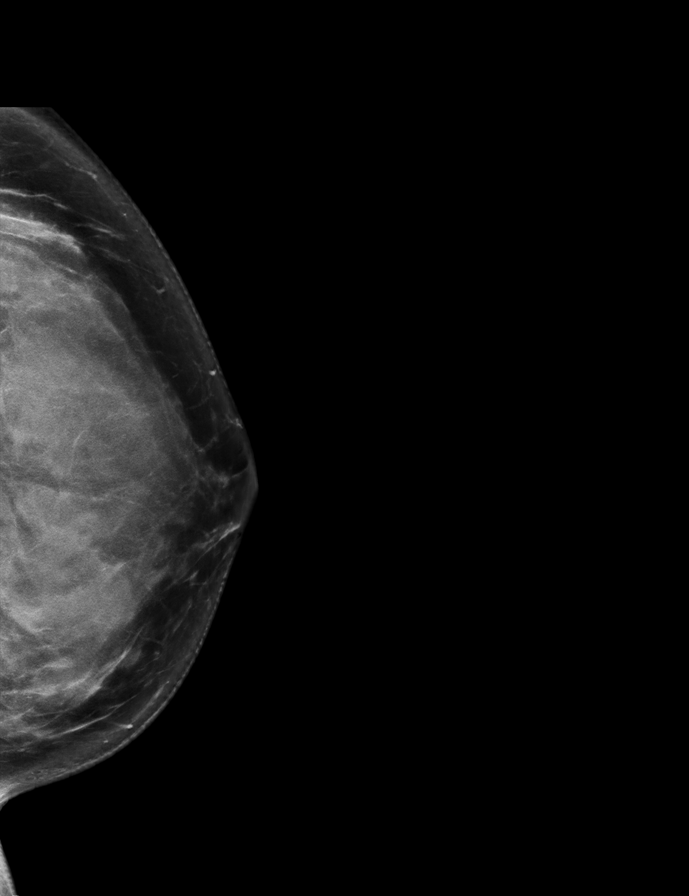

[R CC synth-2D]
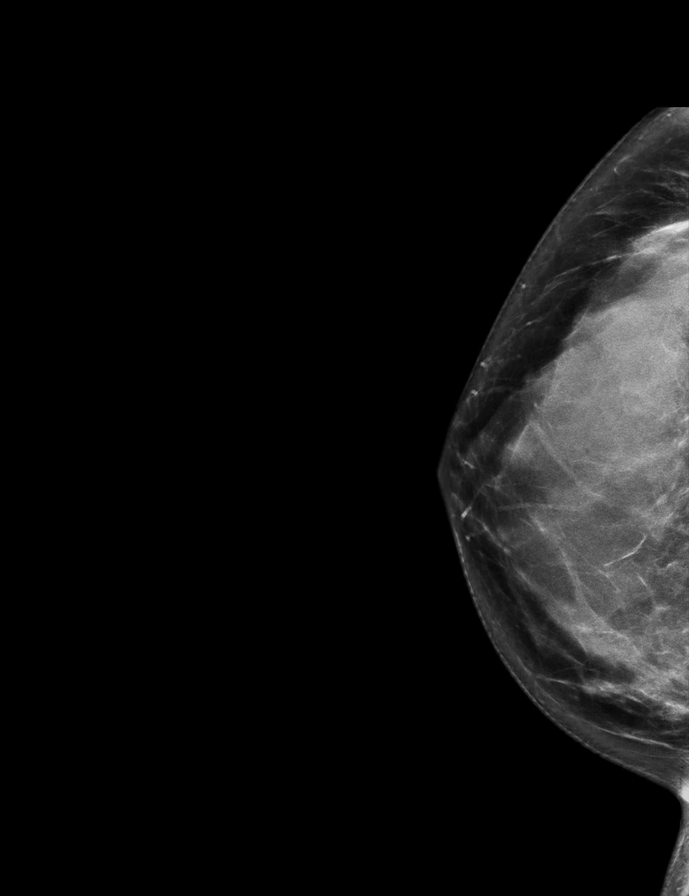

[L CC tomo · 2 of 74 frames shown]
[frame 24/74]
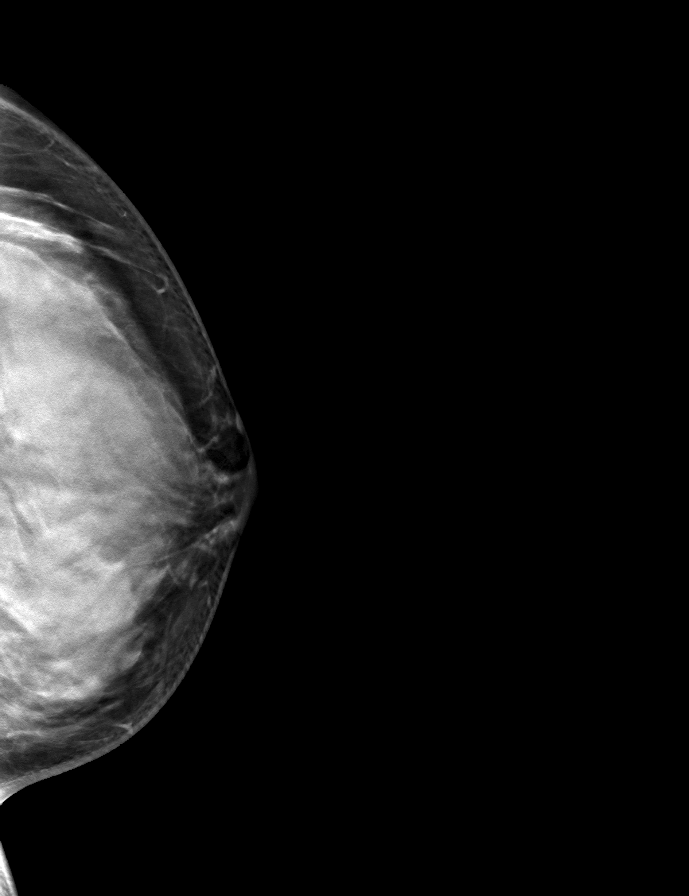
[frame 37/74]
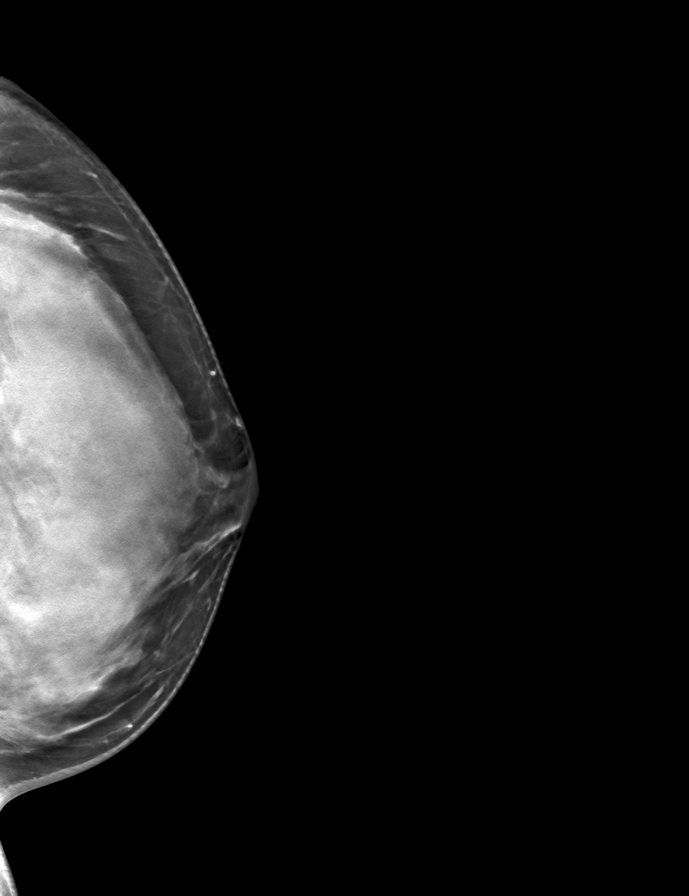

[R CC tomo · tomo slice 37/74.0]
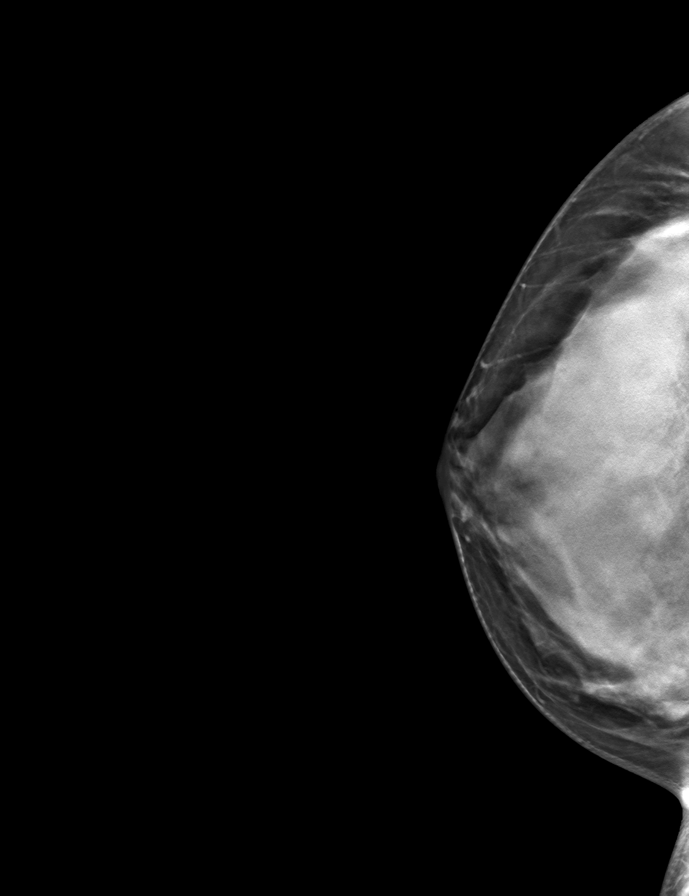

[R MLO tomo · tomo slice 35/69.0]
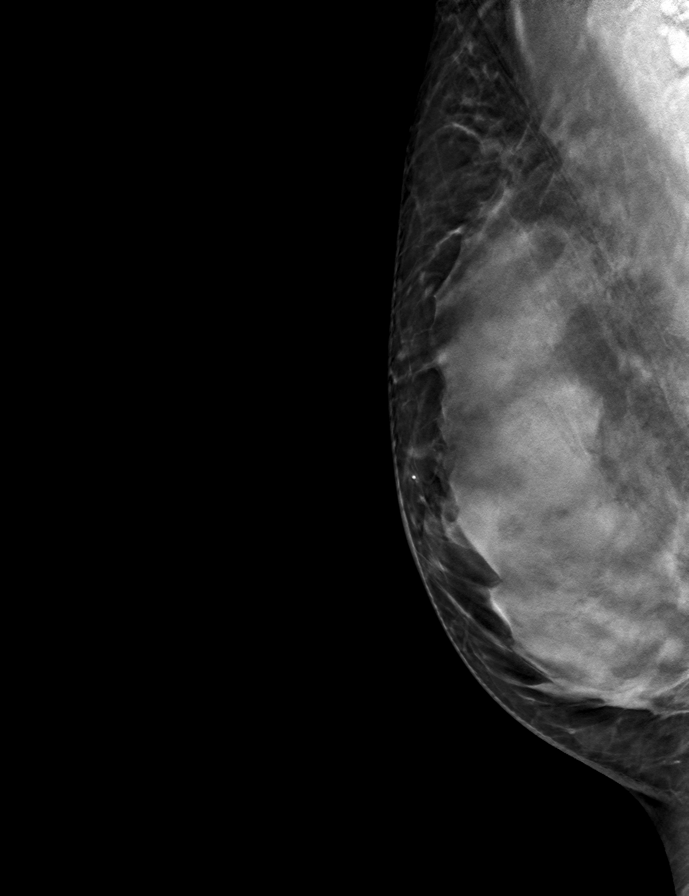

[L MLO tomo · tomo slice 36/71.0]
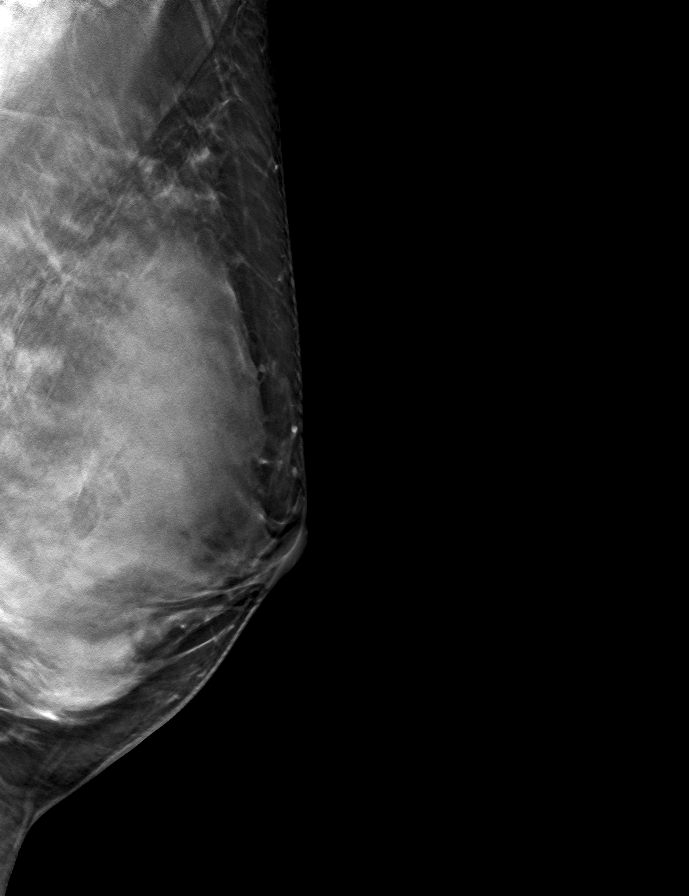

[9 of 24 positions shown; findings below may reference images not displayed]

ACR Breast Density Category d: The breast tissue is extremely dense,
which lowers the sensitivity of mammography
FINDINGS: There are no findings suspicious for malignancy. Images were
processed with CAD.
IMPRESSION: No mammographic evidence of malignancy. A result letter of this
screening mammogram will be mailed directly to the patient.

RECOMMENDATION:
Screening mammogram in one year. (Code:WO-[id number])

BI-RADS CATEGORY  1: Negative.

## 2021-06-25 DIAGNOSIS — J40 Bronchitis, not specified as acute or chronic: Secondary | ICD-10-CM | POA: Diagnosis not present

## 2021-06-25 DIAGNOSIS — J040 Acute laryngitis: Secondary | ICD-10-CM | POA: Diagnosis not present

## 2021-06-25 DIAGNOSIS — J329 Chronic sinusitis, unspecified: Secondary | ICD-10-CM | POA: Diagnosis not present

## 2021-07-09 ENCOUNTER — Other Ambulatory Visit: Payer: Self-pay | Admitting: Nurse Practitioner

## 2021-07-09 ENCOUNTER — Other Ambulatory Visit (HOSPITAL_COMMUNITY)
Admission: RE | Admit: 2021-07-09 | Discharge: 2021-07-09 | Disposition: A | Discharge status: Home / Self Care | Payer: BC Managed Care – PPO | Source: Ambulatory Visit | Attending: Nurse Practitioner | Admitting: Nurse Practitioner

## 2021-07-09 DIAGNOSIS — N920 Excessive and frequent menstruation with regular cycle: Secondary | ICD-10-CM | POA: Diagnosis not present

## 2021-07-09 DIAGNOSIS — N943 Premenstrual tension syndrome: Secondary | ICD-10-CM | POA: Diagnosis not present

## 2021-07-09 DIAGNOSIS — Z01419 Encounter for gynecological examination (general) (routine) without abnormal findings: Secondary | ICD-10-CM | POA: Diagnosis not present

## 2021-07-09 DIAGNOSIS — Z124 Encounter for screening for malignant neoplasm of cervix: Secondary | ICD-10-CM | POA: Diagnosis not present

## 2021-07-09 DIAGNOSIS — Z30011 Encounter for initial prescription of contraceptive pills: Secondary | ICD-10-CM | POA: Diagnosis not present

## 2021-07-09 DIAGNOSIS — N946 Dysmenorrhea, unspecified: Secondary | ICD-10-CM | POA: Diagnosis not present

## 2021-07-11 LAB — CYTOLOGY - PAP
Comment: NEGATIVE
Diagnosis: NEGATIVE
Diagnosis: REACTIVE
High risk HPV: NEGATIVE

## 2021-07-17 DIAGNOSIS — L700 Acne vulgaris: Secondary | ICD-10-CM | POA: Diagnosis not present

## 2021-07-17 DIAGNOSIS — L821 Other seborrheic keratosis: Secondary | ICD-10-CM | POA: Diagnosis not present

## 2021-07-23 DIAGNOSIS — N920 Excessive and frequent menstruation with regular cycle: Secondary | ICD-10-CM | POA: Diagnosis not present

## 2021-07-23 DIAGNOSIS — N946 Dysmenorrhea, unspecified: Secondary | ICD-10-CM | POA: Diagnosis not present

## 2021-09-06 ENCOUNTER — Other Ambulatory Visit: Payer: Self-pay | Admitting: Family Medicine

## 2021-09-06 DIAGNOSIS — Z1231 Encounter for screening mammogram for malignant neoplasm of breast: Secondary | ICD-10-CM

## 2021-10-02 ENCOUNTER — Ambulatory Visit
Admission: RE | Admit: 2021-10-02 | Discharge: 2021-10-02 | Disposition: A | Discharge status: Home / Self Care | Payer: BC Managed Care – PPO | Source: Ambulatory Visit | Attending: Family Medicine | Admitting: Family Medicine

## 2021-10-02 DIAGNOSIS — Z1231 Encounter for screening mammogram for malignant neoplasm of breast: Secondary | ICD-10-CM

## 2021-10-22 DIAGNOSIS — N943 Premenstrual tension syndrome: Secondary | ICD-10-CM | POA: Diagnosis not present

## 2021-10-22 DIAGNOSIS — N920 Excessive and frequent menstruation with regular cycle: Secondary | ICD-10-CM | POA: Diagnosis not present

## 2021-10-22 DIAGNOSIS — D649 Anemia, unspecified: Secondary | ICD-10-CM | POA: Diagnosis not present

## 2021-10-22 DIAGNOSIS — N946 Dysmenorrhea, unspecified: Secondary | ICD-10-CM | POA: Diagnosis not present

## 2021-10-29 ENCOUNTER — Ambulatory Visit: Payer: BC Managed Care – PPO | Admitting: Internal Medicine

## 2021-10-29 ENCOUNTER — Encounter: Payer: Self-pay | Admitting: Internal Medicine

## 2021-10-29 VITALS — BP 106/68 | HR 96 | Temp 98.6°F | Resp 16 | Ht 62.0 in | Wt 156.4 lb

## 2021-10-29 DIAGNOSIS — J310 Chronic rhinitis: Secondary | ICD-10-CM | POA: Diagnosis not present

## 2021-10-29 DIAGNOSIS — R490 Dysphonia: Secondary | ICD-10-CM | POA: Diagnosis not present

## 2021-10-29 DIAGNOSIS — K219 Gastro-esophageal reflux disease without esophagitis: Secondary | ICD-10-CM

## 2021-10-29 MED ORDER — OMEPRAZOLE 40 MG PO CPDR: 40.0000 mg | DELAYED_RELEASE_CAPSULE | Freq: Every day | ORAL | 0 refills | Status: DC

## 2021-10-29 NOTE — Patient Instructions (Addendum)
Rhinitis: - Positive skin test to: none - Use nasal saline rinses before nose sprays such as with Neilmed Sinus Rinse.  Use distilled water.   - Use Flonase 2 sprays each nostril daily. Aim upward and outward.  GERD - Start Omeprazole 40mg  daily.

## 2021-10-29 NOTE — Progress Notes (Signed)
NEW PATIENT  Date of Service/Encounter:  10/29/21  Consult requested by: Aliene Beams, MD   Subjective:   Vanessa [name] (DOB: [birth year]) is a 41 y.o. female who presents to the clinic on 10/29/2021 with a chief complaint of Horseness (Says she noticed it about two or three years ago but it has become more consistent over the past six months. States that when she goes out in the evening times she is prepared for her voice to be gone the next day which usually is the case. Patient also states she notices's it during weather changes as well. ) .    History obtained from: chart review and patient.  Rhinitis:  Started about couple years ago but worse in past 6 months Symptoms include:  sinus headaches, nasal congestion, rhinorrhea, and post nasal drainage . Her most concerning symptom is her hoarseness which seems to worsen at the end of the day especially with talking.  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  Occurs year-round Potential triggers: weather changes  Treatments tried: humidifier, Flonase PRN  Previous allergy testing: no History of reflux/heartburn: no recent symptoms but about 4-5 years ago, she used to take Nexium and has even had to get her esophagus stretched.  History of chronic sinusitis or sinus surgery: no   Chart review does show she saw ENT in 2016 and had vocal cord swelling and was started on Nexium for reflux.      Past Medical History: Past Medical History:  Diagnosis Date   GERD (gastroesophageal reflux disease)    INTERMITTENT SYMPTOMS   No pertinent past medical history    Seasonal allergies     Past Surgical History: Past Surgical History:  Procedure Laterality Date   COLONOSCOPY  07/29/2011   Procedure: COLONOSCOPY;  Surgeon: West Bali, MD;  [facility];  Service: Endoscopy;  Laterality: N/A;  10:00   ESOPHAGOGASTRODUODENOSCOPY  04/08/2006   FOOT SURGERY Left 03/10/2020   NO PAST SURGERIES      Family  History: Family History  Problem Relation Age of Onset   Diabetes Mother    Myasthenia gravis Father    Cancer Maternal Aunt    Colon cancer Neg Hx    Colon polyps Neg Hx     Social History:  Lives in a [address] year old house Flooring in bedroom: wood Pets: none Tobacco use/exposure: none Job: cosmetologist  Medication List:  Allergies as of 10/29/2021   No Known Allergies      Medication List        Accurate as of October 29, 2021  4:59 PM. If you have any questions, ask your nurse or doctor.          STOP taking these medications    furosemide 20 MG tablet Commonly known as: LASIX Stopped by: Birder Robson, MD   ibuprofen 200 MG tablet Commonly known as: ADVIL Stopped by: Birder Robson, MD   Vitamin D (Ergocalciferol) 1.25 MG (50000 UNIT) Caps capsule Commonly known as: DRISDOL Stopped by: Birder Robson, MD       TAKE these medications    drospirenone-ethinyl estradiol 3-0.02 MG tablet Commonly known as: YAZ Take 1 tablet by mouth daily.   High Potency Iron 65 MG Tabs Take 2 tablets by mouth daily.   omeprazole 40 MG capsule Commonly known as: PRILOSEC Take 1 capsule (40 mg total) by mouth daily. Started by: Birder Robson, MD  REVIEW OF SYSTEMS: Pertinent positives and negatives discussed in HPI.   Objective:   Physical Exam: BP 106/68   Pulse 96   Temp 98.6 F (37 C) (Temporal)   Resp 16   Ht 5\' 2"  (1.575 m)   Wt 156 lb 6.4 oz (70.9 kg)   SpO2 98%   BMI 28.61 kg/m  Body mass index is 28.61 kg/m. GEN: alert, well developed HEENT: clear conjunctiva, TM grey and translucent, nose with + inferior turbinate hypertrophy, pale nasal mucosa, slight clear rhinorrhea, + cobblestoning HEART: regular rate and rhythm, no murmur LUNGS: clear to auscultation bilaterally, no coughing, unlabored respiration ABDOMEN: soft, non distended  SKIN: no rashes or lesions  Reviewed: records from ENT from 2016  Skin Testing:  Skin prick testing  was placed, which includes aeroallergens/foods, histamine control, and saline control.  Verbal consent was obtained prior to placing test.  We discussed risks including anaphylaxis. Patient tolerated procedure well.  Allergy testing results were read and interpreted by myself, documented by clinical staff. Adequate positive and negative control.  Results discussed with patient/family.  Airborne Adult Perc - 10/29/21 1517     Time Antigen Placed 1517    Allergen Manufacturer 12/29/21    Location Back    Number of Test 59    1. Control-Buffer 50% Glycerol Negative    2. Control-Histamine 1 mg/ml 3+    3. Albumin saline Negative    4. Bahia Negative    5. Waynette Buttery Negative    6. Johnson Negative    7. [place] Blue Negative    8. Meadow Fescue Negative    9. Perennial Rye Negative    10. Sweet Vernal Negative    11. Timothy Negative    12. Cocklebur Negative    13. Burweed Marshelder Negative    14. Ragweed, short Negative    15. Ragweed, Giant Negative    16. Plantain,  English Negative    17. Lamb's Quarters Negative    18. Sheep Sorrell Negative    19. Rough Pigweed Negative    20. Marsh Elder, Rough Negative    21. Mugwort, Common Negative    22. Ash mix Negative    23. Birch mix Negative    24. Beech American Negative    25. Box, Elder Negative    26. Cedar, red Negative    27. Cottonwood, [place] Negative    28. Elm mix Negative    29. Hickory Negative    30. Maple mix Negative    31. Oak, [place] mix Negative    32. Pecan Pollen Negative    33. Pine mix Negative    34. Sycamore Eastern Negative    35. Walnut, Black Pollen Negative    36. Alternaria alternata Negative    37. Cladosporium Herbarum Negative    38. Aspergillus mix Negative    39. Penicillium mix Negative    40. Bipolaris sorokiniana (Helminthosporium) Negative    41. Drechslera spicifera (Curvularia) Negative    42. Mucor plumbeus Negative    43. Fusarium moniliforme Negative    44. Aureobasidium  pullulans (pullulara) Negative    45. Rhizopus oryzae Negative    46. Botrytis cinera Negative    47. Epicoccum nigrum Negative    48. Phoma betae Negative    49. Candida Albicans Negative    50. Trichophyton mentagrophytes Negative    51. Mite, D Farinae  5,000 AU/ml Negative    52. Mite, D Pteronyssinus  5,000 AU/ml Negative    53. Cat  Hair 10,000 BAU/ml Negative    54.  Dog Epithelia Negative    55. Mixed Feathers Negative    56. Horse Epithelia Negative    57. Cockroach, German Negative    58. Mouse Negative    59. Tobacco Leaf Negative             Intradermal - 10/29/21 1516     Allergen Manufacturer Lavella Hammock    Location Arm    Number of Test 15    Control Negative    Gu[place] Negative    Johnson Negative    7 Grass Negative    Ragweed mix Negative    Weed mix Negative    Tree mix Negative    Mold 1 Negative    Mold 2 Negative    Mold 3 Negative    Mold 4 Negative    Cat Negative    Dog Negative    Cockroach Negative    Mite mix Negative               Assessment:   1. Chronic rhinitis   2. Hoarseness   3. Gastroesophageal reflux disease, unspecified whether esophagitis present     Plan/Recommendations:  Chronic Rhinitis - I do not believe that uncontrolled rhinitis is causing her hoarseness.  Will refer to ENT for further evaluation. - Positive skin test to: none - Use nasal saline rinses before nose sprays such as with Neilmed Sinus Rinse.  Use distilled water.   - Use Flonase 2 sprays each nostril daily. Aim upward and outward.  GERD - Can result in hoarseness and vocal cord irritation. - Start Omeprazole 40mg  daily.      Return if symptoms worsen or fail to improve.  Harlon Flor, MD [facility]

## 2021-11-01 ENCOUNTER — Telehealth: Payer: Self-pay

## 2021-11-01 NOTE — Telephone Encounter (Signed)
Patient's mom called to see if our office is going to place an ENT referral. I did see in Dr Serita Grit note to move forward with a referral. I will send the patient a mychart update with the referral information. '

## 2021-11-06 DIAGNOSIS — K219 Gastro-esophageal reflux disease without esophagitis: Secondary | ICD-10-CM | POA: Diagnosis not present

## 2021-11-06 DIAGNOSIS — R490 Dysphonia: Secondary | ICD-10-CM | POA: Diagnosis not present

## 2021-11-07 DIAGNOSIS — R498 Other voice and resonance disorders: Secondary | ICD-10-CM | POA: Diagnosis not present

## 2021-11-07 DIAGNOSIS — R1310 Dysphagia, unspecified: Secondary | ICD-10-CM | POA: Diagnosis not present

## 2021-11-12 ENCOUNTER — Ambulatory Visit: Payer: BC Managed Care – PPO | Admitting: Internal Medicine

## 2021-11-19 DIAGNOSIS — M79672 Pain in left foot: Secondary | ICD-10-CM | POA: Diagnosis not present

## 2021-11-22 ENCOUNTER — Ambulatory Visit: Payer: BC Managed Care – PPO | Admitting: Internal Medicine

## 2021-11-22 ENCOUNTER — Encounter: Payer: Self-pay | Admitting: Internal Medicine

## 2021-11-22 VITALS — BP 123/75 | HR 70 | Temp 98.3°F | Ht 63.0 in | Wt 154.0 lb

## 2021-11-22 DIAGNOSIS — Z1211 Encounter for screening for malignant neoplasm of colon: Secondary | ICD-10-CM

## 2021-11-22 DIAGNOSIS — R1319 Other dysphagia: Secondary | ICD-10-CM | POA: Diagnosis not present

## 2021-11-22 DIAGNOSIS — R490 Dysphonia: Secondary | ICD-10-CM

## 2021-11-22 NOTE — Patient Instructions (Addendum)
We will schedule you for upper endoscopy to further evaluate your issues with swallowing as well as chronic hoarseness.  I may elect to stretch your esophagus depending on findings.  I want you to start taking omeprazole 40 mg each morning 30 minutes before breakfast.  You will be due for colonoscopy for colon cancer screening at age 41.  Further recommendations to follow.  It was very nice meeting you today.  Dr. Abbey Chatters

## 2021-11-22 NOTE — H&P (View-Only) (Signed)
Primary Care Physician:  Aliene Beams, MD Primary Gastroenterologist:  Dr. Marletta Lor  Chief Complaint  Patient presents with   New Patient (Initial Visit)    Pt having trouble with food getting stuck. Coughing in her sleep    HPI:   Vanessa [name] is a 41 y.o. female who presents to clinic today by referral from her PCP Dr. Tracie Harrier for evaluation.  Patient is chronically worsening hoarseness.  Evaluated by ENT who felt patient's symptoms were related to acid reflux.  Patient denies any heartburn.  No epigastric or chest pain.  Does note esophageal dysphagia.  Primarily with food, sometimes pills.  No melena or hematochezia.  Was recently started on omeprazole 40 mg daily, but states she has not picked this medication up yet.  States she had an EGD 12 years ago requiring esophageal dilation.  I do not have access to this report however.  Colonoscopy 2013 with hemorrhoids.  Otherwise unremarkable.  No family history of colorectal malignancy.  Past Medical History:  Diagnosis Date   GERD (gastroesophageal reflux disease)    INTERMITTENT SYMPTOMS   No pertinent past medical history    Seasonal allergies     Past Surgical History:  Procedure Laterality Date   COLONOSCOPY  07/29/2011   Procedure: COLONOSCOPY;  Surgeon: West Bali, MD;  [facility];  Service: Endoscopy;  Laterality: N/A;  10:00   ESOPHAGOGASTRODUODENOSCOPY  04/08/2006   FOOT SURGERY Left 03/10/2020   NO PAST SURGERIES      Current Outpatient Medications  Medication Sig Dispense Refill   drospirenone-ethinyl estradiol (YAZ) 3-0.02 MG tablet Take 1 tablet by mouth daily.     Ferrous Sulfate Dried (HIGH POTENCY IRON) 65 MG TABS Take 2 tablets by mouth daily.     omeprazole (PRILOSEC) 40 MG capsule Take 1 capsule (40 mg total) by mouth daily. (Patient not taking: Reported on 11/22/2021) 30 capsule 0   No current facility-administered medications for this visit.    Allergies as of 11/22/2021    (No Known Allergies)    Family History  Problem Relation Age of Onset   Diabetes Mother    Myasthenia gravis Father    Cancer Maternal Aunt    Colon cancer Neg Hx    Colon polyps Neg Hx     Social History   Socioeconomic History   Marital status: Single    Spouse name: Not on file   Number of children: Not on file   Years of education: Not on file   Highest education level: Not on file  Occupational History   Not on file  Tobacco Use   Smoking status: Never   Smokeless tobacco: Never  Vaping Use   Vaping Use: Not on file  Substance and Sexual Activity   Alcohol use: No   Drug use: No   Sexual activity: Not Currently    Birth control/protection: None  Other Topics Concern   Not on file  Social History Narrative   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   Single. Never married. No children.   Enjoys event planning/weddings.   Eats all food groups.   Does not eat pork.    Eats veggies/fruit.    Does not exercise.    Social Determinants of Health   Financial Resource Strain: Not on file  Food Insecurity: Not on file  Transportation Needs: Not on file  Physical Activity: Not on file  Stress: Not on file  Social Connections: Not on file  Intimate Partner Violence: Not on file    Subjective: Review of Systems  Constitutional:  Negative for chills and fever.  HENT:  Negative for congestion and hearing loss.   Eyes:  Negative for blurred vision and double vision.  Respiratory:  Negative for cough and shortness of breath.   Cardiovascular:  Negative for chest pain and palpitations.  Gastrointestinal:  Negative for abdominal pain, blood in stool, constipation, diarrhea, heartburn, melena and vomiting.       Dysphagia, hoarseness  Genitourinary:  Negative for dysuria and urgency.  Musculoskeletal:  Negative for joint pain and myalgias.  Skin:  Negative for itching and rash.  Neurological:  Negative for dizziness and headaches.  Psychiatric/Behavioral:   Negative for depression. The patient is not nervous/anxious.        Objective: BP 123/75   Pulse 70   Temp 98.3 F (36.8 C)   Ht 5\' 3"  (1.6 m)   Wt 154 lb (69.9 kg)   LMP 11/22/2021   BMI 27.28 kg/m  Physical Exam Constitutional:      Appearance: Normal appearance.  HENT:     Head: Normocephalic and atraumatic.  Eyes:     Extraocular Movements: Extraocular movements intact.     Conjunctiva/sclera: Conjunctivae normal.  Cardiovascular:     Rate and Rhythm: Normal rate and regular rhythm.  Pulmonary:     Effort: Pulmonary effort is normal.     Breath sounds: Normal breath sounds.  Abdominal:     General: Bowel sounds are normal.     Palpations: Abdomen is soft.  Musculoskeletal:        General: No swelling. Normal range of motion.     Cervical back: Normal range of motion and neck supple.  Skin:    General: Skin is warm and dry.     Coloration: Skin is not jaundiced.  Neurological:     General: No focal deficit present.     Mental Status: She is alert and oriented to person, place, and time.  Psychiatric:        Mood and Affect: Mood normal.        Behavior: Behavior normal.      Assessment: *Esophageal dysphagia  *Chronic hoarseness *Chronic cough  *Colon cancer screening   Plan: Will schedule for EGD with possible dilation to evaluate for peptic ulcer disease, esophagitis, gastritis, H. Pylori, duodenitis, or other. Will also evaluate for esophageal stricture, Schatzki's ring, esophageal web or other.   The risks including infection, bleed, or perforation as well as benefits, limitations, alternatives and imponderables have been reviewed with the patient. Potential for esophageal dilation, biopsy, etc. have also been reviewed.  Questions have been answered. All parties agreeable.  Recommended patient start taking her omeprazole every day.  Colonoscopy for colon cancer screening due at age 27.  Thank you Dr. Mannie Stabile for the kind referral.  11/22/2021 4:21  PM   Disclaimer: This note was dictated with voice recognition software. Similar sounding words can inadvertently be transcribed and may not be corrected upon review.

## 2021-11-22 NOTE — Progress Notes (Signed)
Primary Care Physician:  Aliene Beams, MD Primary Gastroenterologist:  Dr. Marletta Lor  Chief Complaint  Patient presents with   New Patient (Initial Visit)    Pt having trouble with food getting stuck. Coughing in her sleep    HPI:   Vanessa [name] is a 41 y.o. female who presents to clinic today by referral from her PCP Dr. Tracie Harrier for evaluation.  Patient is chronically worsening hoarseness.  Evaluated by ENT who felt patient's symptoms were related to acid reflux.  Patient denies any heartburn.  No epigastric or chest pain.  Does note esophageal dysphagia.  Primarily with food, sometimes pills.  No melena or hematochezia.  Was recently started on omeprazole 40 mg daily, but states she has not picked this medication up yet.  States she had an EGD 12 years ago requiring esophageal dilation.  I do not have access to this report however.  Colonoscopy 2013 with hemorrhoids.  Otherwise unremarkable.  No family history of colorectal malignancy.  Past Medical History:  Diagnosis Date   GERD (gastroesophageal reflux disease)    INTERMITTENT SYMPTOMS   No pertinent past medical history    Seasonal allergies     Past Surgical History:  Procedure Laterality Date   COLONOSCOPY  07/29/2011   Procedure: COLONOSCOPY;  Surgeon: West Bali, MD;  [facility];  Service: Endoscopy;  Laterality: N/A;  10:00   ESOPHAGOGASTRODUODENOSCOPY  04/08/2006   FOOT SURGERY Left 03/10/2020   NO PAST SURGERIES      Current Outpatient Medications  Medication Sig Dispense Refill   drospirenone-ethinyl estradiol (YAZ) 3-0.02 MG tablet Take 1 tablet by mouth daily.     Ferrous Sulfate Dried (HIGH POTENCY IRON) 65 MG TABS Take 2 tablets by mouth daily.     omeprazole (PRILOSEC) 40 MG capsule Take 1 capsule (40 mg total) by mouth daily. (Patient not taking: Reported on 11/22/2021) 30 capsule 0   No current facility-administered medications for this visit.    Allergies as of 11/22/2021    (No Known Allergies)    Family History  Problem Relation Age of Onset   Diabetes Mother    Myasthenia gravis Father    Cancer Maternal Aunt    Colon cancer Neg Hx    Colon polyps Neg Hx     Social History   Socioeconomic History   Marital status: Single    Spouse name: Not on file   Number of children: Not on file   Years of education: Not on file   Highest education level: Not on file  Occupational History   Not on file  Tobacco Use   Smoking status: Never   Smokeless tobacco: Never  Vaping Use   Vaping Use: Not on file  Substance and Sexual Activity   Alcohol use: No   Drug use: No   Sexual activity: Not Currently    Birth control/protection: None  Other Topics Concern   Not on file  Social History Narrative   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   Single. Never married. No children.   Enjoys event planning/weddings.   Eats all food groups.   Does not eat pork.    Eats veggies/fruit.    Does not exercise.    Social Determinants of Health   Financial Resource Strain: Not on file  Food Insecurity: Not on file  Transportation Needs: Not on file  Physical Activity: Not on file  Stress: Not on file  Social Connections: Not on file  Intimate Partner Violence: Not on file    Subjective: Review of Systems  Constitutional:  Negative for chills and fever.  HENT:  Negative for congestion and hearing loss.   Eyes:  Negative for blurred vision and double vision.  Respiratory:  Negative for cough and shortness of breath.   Cardiovascular:  Negative for chest pain and palpitations.  Gastrointestinal:  Negative for abdominal pain, blood in stool, constipation, diarrhea, heartburn, melena and vomiting.       Dysphagia, hoarseness  Genitourinary:  Negative for dysuria and urgency.  Musculoskeletal:  Negative for joint pain and myalgias.  Skin:  Negative for itching and rash.  Neurological:  Negative for dizziness and headaches.  Psychiatric/Behavioral:   Negative for depression. The patient is not nervous/anxious.        Objective: BP 123/75   Pulse 70   Temp 98.3 F (36.8 C)   Ht 5\' 3"  (1.6 m)   Wt 154 lb (69.9 kg)   LMP 11/22/2021   BMI 27.28 kg/m  Physical Exam Constitutional:      Appearance: Normal appearance.  HENT:     Head: Normocephalic and atraumatic.  Eyes:     Extraocular Movements: Extraocular movements intact.     Conjunctiva/sclera: Conjunctivae normal.  Cardiovascular:     Rate and Rhythm: Normal rate and regular rhythm.  Pulmonary:     Effort: Pulmonary effort is normal.     Breath sounds: Normal breath sounds.  Abdominal:     General: Bowel sounds are normal.     Palpations: Abdomen is soft.  Musculoskeletal:        General: No swelling. Normal range of motion.     Cervical back: Normal range of motion and neck supple.  Skin:    General: Skin is warm and dry.     Coloration: Skin is not jaundiced.  Neurological:     General: No focal deficit present.     Mental Status: She is alert and oriented to person, place, and time.  Psychiatric:        Mood and Affect: Mood normal.        Behavior: Behavior normal.      Assessment: *Esophageal dysphagia  *Chronic hoarseness *Chronic cough  *Colon cancer screening   Plan: Will schedule for EGD with possible dilation to evaluate for peptic ulcer disease, esophagitis, gastritis, H. Pylori, duodenitis, or other. Will also evaluate for esophageal stricture, Schatzki's ring, esophageal web or other.   The risks including infection, bleed, or perforation as well as benefits, limitations, alternatives and imponderables have been reviewed with the patient. Potential for esophageal dilation, biopsy, etc. have also been reviewed.  Questions have been answered. All parties agreeable.  Recommended patient start taking her omeprazole every day.  Colonoscopy for colon cancer screening due at age 27.  Thank you Dr. Mannie Stabile for the kind referral.  11/22/2021 4:21  PM   Disclaimer: This note was dictated with voice recognition software. Similar sounding words can inadvertently be transcribed and may not be corrected upon review.

## 2021-11-27 ENCOUNTER — Encounter: Payer: Self-pay | Admitting: *Deleted

## 2021-11-28 ENCOUNTER — Encounter: Payer: Self-pay | Admitting: *Deleted

## 2021-11-28 DIAGNOSIS — R1319 Other dysphagia: Secondary | ICD-10-CM

## 2021-12-04 ENCOUNTER — Telehealth: Payer: Self-pay | Admitting: *Deleted

## 2021-12-04 NOTE — Telephone Encounter (Signed)
Called pt and she has moved procedure up to 11/27 at 12:30pm. Aware will send instructions via mychart. Awaiting call from endo to see when patient will need to do urine preg test since closed thur/fri prior.

## 2021-12-12 ENCOUNTER — Encounter (HOSPITAL_COMMUNITY)
Admission: RE | Admit: 2021-12-12 | Discharge: 2021-12-12 | Disposition: A | Discharge status: Home / Self Care | Payer: BC Managed Care – PPO | Source: Ambulatory Visit | Attending: Internal Medicine | Admitting: Internal Medicine

## 2021-12-12 DIAGNOSIS — Z01818 Encounter for other preprocedural examination: Secondary | ICD-10-CM

## 2021-12-17 ENCOUNTER — Ambulatory Visit (HOSPITAL_COMMUNITY): Payer: BC Managed Care – PPO | Admitting: Anesthesiology

## 2021-12-17 ENCOUNTER — Encounter (HOSPITAL_COMMUNITY)
Admission: RE | Disposition: A | Discharge status: Home / Self Care | Payer: Self-pay | Source: Home / Self Care | Attending: Internal Medicine

## 2021-12-17 ENCOUNTER — Ambulatory Visit (HOSPITAL_COMMUNITY)
Admission: RE | Admit: 2021-12-17 | Discharge: 2021-12-17 | Disposition: A | Discharge status: Home / Self Care | Payer: BC Managed Care – PPO | Attending: Internal Medicine | Admitting: Internal Medicine

## 2021-12-17 ENCOUNTER — Encounter (HOSPITAL_COMMUNITY): Payer: Self-pay

## 2021-12-17 ENCOUNTER — Telehealth: Payer: Self-pay

## 2021-12-17 DIAGNOSIS — K219 Gastro-esophageal reflux disease without esophagitis: Secondary | ICD-10-CM | POA: Insufficient documentation

## 2021-12-17 DIAGNOSIS — R490 Dysphonia: Secondary | ICD-10-CM | POA: Insufficient documentation

## 2021-12-17 DIAGNOSIS — R1314 Dysphagia, pharyngoesophageal phase: Secondary | ICD-10-CM | POA: Insufficient documentation

## 2021-12-17 DIAGNOSIS — K2970 Gastritis, unspecified, without bleeding: Secondary | ICD-10-CM | POA: Diagnosis not present

## 2021-12-17 DIAGNOSIS — Z01818 Encounter for other preprocedural examination: Secondary | ICD-10-CM

## 2021-12-17 DIAGNOSIS — R1310 Dysphagia, unspecified: Secondary | ICD-10-CM | POA: Diagnosis not present

## 2021-12-17 HISTORY — PX: ESOPHAGOGASTRODUODENOSCOPY (EGD) WITH PROPOFOL: SHX5813

## 2021-12-17 HISTORY — PX: BIOPSY: SHX5522

## 2021-12-17 LAB — POCT PREGNANCY, URINE: Preg Test, Ur: NEGATIVE

## 2021-12-17 SURGERY — ESOPHAGOGASTRODUODENOSCOPY (EGD) WITH PROPOFOL
Anesthesia: General

## 2021-12-17 MED ORDER — PROPOFOL 10 MG/ML IV BOLUS
INTRAVENOUS | Status: DC | PRN
  Administered 2021-12-17: 50 mg via INTRAVENOUS
  Administered 2021-12-17: 100 mg via INTRAVENOUS

## 2021-12-17 MED ORDER — LIDOCAINE HCL (CARDIAC) PF 100 MG/5ML IV SOSY
PREFILLED_SYRINGE | INTRAVENOUS | Status: DC | PRN
  Administered 2021-12-17: 50 mg via INTRAVENOUS

## 2021-12-17 MED ORDER — PANTOPRAZOLE SODIUM 40 MG PO TBEC: 40.0000 mg | DELAYED_RELEASE_TABLET | Freq: Every day | ORAL | 11 refills | Status: DC

## 2021-12-17 MED ORDER — LACTATED RINGERS IV SOLN: INTRAVENOUS | Status: DC

## 2021-12-17 NOTE — Discharge Instructions (Signed)
EGD Discharge instructions Please read the instructions outlined below and refer to this sheet in the next few weeks. These discharge instructions provide you with general information on caring for yourself after you leave the hospital. Your doctor may also give you specific instructions. While your treatment has been planned according to the most current medical practices available, unavoidable complications occasionally occur. If you have any problems or questions after discharge, please call your doctor. ACTIVITY You may resume your regular activity but move at a slower pace for the next 24 hours.  Take frequent rest periods for the next 24 hours.  Walking will help expel (get rid of) the air and reduce the bloated feeling in your abdomen.  No driving for 24 hours (because of the anesthesia (medicine) used during the test).  You may shower.  Do not sign any important legal documents or operate any machinery for 24 hours (because of the anesthesia used during the test).  NUTRITION Drink plenty of fluids.  You may resume your normal diet.  Begin with a light meal and progress to your normal diet.  Avoid alcoholic beverages for 24 hours or as instructed by your caregiver.  MEDICATIONS You may resume your normal medications unless your caregiver tells you otherwise.  WHAT YOU CAN EXPECT TODAY You may experience abdominal discomfort such as a feeling of fullness or "gas" pains.  FOLLOW-UP Your doctor will discuss the results of your test with you.  SEEK IMMEDIATE MEDICAL ATTENTION IF ANY OF THE FOLLOWING OCCUR: Excessive nausea (feeling sick to your stomach) and/or vomiting.  Severe abdominal pain and distention (swelling).  Trouble swallowing.  Temperature over 101 F (37.8 C).  Rectal bleeding or vomiting of blood.    Your EGD revealed mild amount inflammation in your stomach.  I took biopsies of this to rule out infection with a bacteria called H. pylori.  Await pathology results, my  office will contact you.  Your esophagus was wide open.  I did not elect to stretch it today.  Your small bowel appeared normal.  I am going to change your omeprazole to pantoprazole 40 mg daily.  I want you to take this every day for 3 months at which point you can switch to as needed.  Follow up with Dr. Marletta Lor in 4 months  I hope you have a great rest of your week!  Vanessa [name]. Marletta Lor, D.O. Gastroenterology and Hepatology [facility]

## 2021-12-17 NOTE — Anesthesia Preprocedure Evaluation (Signed)
Anesthesia Evaluation  Patient identified by MRN, date of birth, ID band Patient awake    Reviewed: Allergy & Precautions, H&P , NPO status , Patient's Chart, lab work & pertinent test results  Airway Mallampati: II  TM Distance: >3 FB Neck ROM: Full    Dental  (+) Dental Advisory Given, Teeth Intact   Pulmonary neg pulmonary ROS   Pulmonary exam normal breath sounds clear to auscultation       Cardiovascular negative cardio ROS Normal cardiovascular exam Rhythm:Regular Rate:Normal     Neuro/Psych negative neurological ROS  negative psych ROS   GI/Hepatic Neg liver ROS,GERD (voice changes)  Medicated,,  Endo/Other  negative endocrine ROS    Renal/GU negative Renal ROS  negative genitourinary   Musculoskeletal negative musculoskeletal ROS (+)    Abdominal   Peds negative pediatric ROS (+)  Hematology negative hematology ROS (+)   Anesthesia Other Findings   Reproductive/Obstetrics negative OB ROS                             Anesthesia Physical Anesthesia Plan  ASA: 2  Anesthesia Plan: General   Post-op Pain Management: Minimal or no pain anticipated   Induction: Intravenous  PONV Risk Score and Plan: Propofol infusion  Airway Management Planned: Nasal Cannula and Natural Airway  Additional Equipment:   Intra-op Plan:   Post-operative Plan:   Informed Consent: I have reviewed the patients History and Physical, chart, labs and discussed the procedure including the risks, benefits and alternatives for the proposed anesthesia with the patient or authorized representative who has indicated his/her understanding and acceptance.     Dental advisory given  Plan Discussed with: CRNA and Surgeon  Anesthesia Plan Comments:        Anesthesia Quick Evaluation

## 2021-12-17 NOTE — Anesthesia Postprocedure Evaluation (Signed)
Anesthesia Post Note  Patient: Vanessa [name]  Procedure(s) Performed: ESOPHAGOGASTRODUODENOSCOPY (EGD) WITH PROPOFOL BIOPSY  Patient location during evaluation: Phase II Anesthesia Type: General Level of consciousness: awake and alert and oriented Pain management: pain level controlled Vital Signs Assessment: post-procedure vital signs reviewed and stable Respiratory status: spontaneous breathing, nonlabored ventilation and respiratory function stable Cardiovascular status: blood pressure returned to baseline and stable Postop Assessment: no apparent nausea or vomiting Anesthetic complications: no  No notable events documented.   Last Vitals:  Vitals:   12/17/21 1107 12/17/21 1243  BP: 117/83 100/60  Pulse: 66 69  Resp: 18 19  Temp: 36.9 C 36.9 C  SpO2: 100% 98%    Last Pain:  Vitals:   12/17/21 1243  TempSrc: Oral  PainSc: 0-No pain                 Cheick Suhr C Adrianne Shackleton

## 2021-12-17 NOTE — Telephone Encounter (Signed)
PA done for Pantoprazole Sodium 40 mg DR tablets on Cover My Meds. Dx used: R13.19 and R13.14. There is no documentation that the pt has taken any OTC medications. Waiting on a response.

## 2021-12-17 NOTE — Transfer of Care (Signed)
Immediate Anesthesia Transfer of Care Note  Patient: Vanessa [name]  Procedure(s) Performed: ESOPHAGOGASTRODUODENOSCOPY (EGD) WITH PROPOFOL BIOPSY  Patient Location: Short Stay  Anesthesia Type:General  Level of Consciousness: awake  Airway & Oxygen Therapy: Patient Spontanous Breathing  Post-op Assessment: Report given to RN and Post -op Vital signs reviewed and stable  Post vital signs: Reviewed and stable  Last Vitals:  Vitals Value Taken Time  BP 100/60 12/17/21 1243  Temp 36.9 C 12/17/21 1243  Pulse 69 12/17/21 1243  Resp 19 12/17/21 1243  SpO2 98 % 12/17/21 1243    Last Pain:  Vitals:   12/17/21 1243  TempSrc: Oral  PainSc: 0-No pain         Complications: No notable events documented.

## 2021-12-17 NOTE — Op Note (Signed)
[facility] Patient Name: Vanessa [name] Procedure Date: 12/17/2021 12:29 PM MRN: [id number] Date of Birth: [birth year] Attending MD: Elon Alas. Abbey Chatters , [place], [id number] CSN: [id number] Age: 41 Admit Type: Outpatient Procedure:                Upper GI endoscopy Indications:              Dysphagia, Hoarseness Providers:                Elon Alas. Abbey Chatters, DO, Lambert Mody, Raphael Gibney, Technician Referring MD:              Medicines:                See the Anesthesia note for documentation of the                            administered medications Complications:            No immediate complications. Estimated Blood Loss:     Estimated blood loss was minimal. Procedure:                Pre-Anesthesia Assessment:                           - The anesthesia plan was to use monitored                            anesthesia care (MAC).                           After obtaining informed consent, the endoscope was                            passed under direct vision. Throughout the                            procedure, the patient's blood pressure, pulse, and                            oxygen saturations were monitored continuously. The                            [id number]-H190 ([id number]) scope was introduced through the                            mouth, and advanced to the second part of duodenum.                            The upper GI endoscopy was accomplished without                            difficulty. The patient tolerated the procedure                            well. Scope In: 12:35:29 PM Scope  Out: 12:38:38 PM Total Procedure Duration: 0 hours 3 minutes 9 seconds  Findings:      The Z-line was regular.      There is no endoscopic evidence of stenosis or stricture in the entire       esophagus.      Patchy mild inflammation characterized by erythema was found in the       gastric body. Biopsies were taken with a cold forceps for Helicobacter        pylori testing.      The duodenal bulb, first portion of the duodenum and second portion of       the duodenum were normal. Impression:               - Z-line regular.                           - Gastritis. Biopsied.                           - Normal duodenal bulb, first portion of the                            duodenum and second portion of the duodenum. Moderate Sedation:      Per Anesthesia Care Recommendation:           - Patient has a contact number available for                            emergencies. The signs and symptoms of potential                            delayed complications were discussed with the                            patient. Return to normal activities tomorrow.                            Written discharge instructions were provided to the                            patient.                           - Resume previous diet.                           - Continue present medications.                           - Await pathology results.                           - Use Protonix (pantoprazole) 40 mg PO daily.                           - Return to GI clinic in 4 months.                           -  Follow up with ENT for hoarseness. May benefit                            from direct laryngoscopy Procedure Code(s):        --- Professional ---                           [phone], Esophagogastroduodenoscopy, flexible,                            transoral; with biopsy, single or multiple Diagnosis Code(s):        --- Professional ---                           K29.70, Gastritis, unspecified, without bleeding                           R13.10, Dysphagia, unspecified CPT copyright 2022 American Medical Association. All rights reserved. The codes documented in this report are preliminary and upon coder review may  be revised to meet current compliance requirements. Elon Alas. Abbey Chatters, DO [place] Abbey Chatters, DO 12/17/2021 1:08:24 PM This report has been signed  electronically. Number of Addenda: 0

## 2021-12-17 NOTE — Interval H&P Note (Signed)
History and Physical Interval Note:  12/17/2021 12:00 PM  Vanessa [name]  has presented today for surgery, with the diagnosis of dysphagia.  The various methods of treatment have been discussed with the patient and family. After consideration of risks, benefits and other options for treatment, the patient has consented to  Procedure(s) with comments: ESOPHAGOGASTRODUODENOSCOPY (EGD) WITH PROPOFOL (N/A) - 8:00 am  ASA 1 BALLOON DILATION (N/A) as a surgical intervention.  The patient's history has been reviewed, patient examined, no change in status, stable for surgery.  I have reviewed the patient's chart and labs.  Questions were answered to the patient's satisfaction.     [name]

## 2021-12-18 LAB — SURGICAL PATHOLOGY

## 2021-12-19 NOTE — Telephone Encounter (Signed)
PA was denied, but I phoned to speak to the pt and she advised she has never tried any OTC antiacids. Pt did get her Rx though. It was only $25.00 for her Rx. Pt is ok with that

## 2021-12-24 DIAGNOSIS — K219 Gastro-esophageal reflux disease without esophagitis: Secondary | ICD-10-CM | POA: Diagnosis not present

## 2021-12-24 DIAGNOSIS — R490 Dysphonia: Secondary | ICD-10-CM | POA: Diagnosis not present

## 2021-12-25 ENCOUNTER — Encounter (HOSPITAL_COMMUNITY): Payer: Self-pay | Admitting: Internal Medicine

## 2021-12-31 DIAGNOSIS — D500 Iron deficiency anemia secondary to blood loss (chronic): Secondary | ICD-10-CM | POA: Diagnosis not present

## 2021-12-31 DIAGNOSIS — N898 Other specified noninflammatory disorders of vagina: Secondary | ICD-10-CM | POA: Diagnosis not present

## 2021-12-31 DIAGNOSIS — N921 Excessive and frequent menstruation with irregular cycle: Secondary | ICD-10-CM | POA: Diagnosis not present

## 2022-01-03 ENCOUNTER — Other Ambulatory Visit (HOSPITAL_COMMUNITY): Payer: BC Managed Care – PPO

## 2022-07-26 ENCOUNTER — Other Ambulatory Visit: Payer: Self-pay | Admitting: *Deleted

## 2022-07-26 DIAGNOSIS — M7989 Other specified soft tissue disorders: Secondary | ICD-10-CM

## 2022-07-29 ENCOUNTER — Encounter: Payer: Self-pay | Admitting: Internal Medicine

## 2022-07-30 NOTE — Telephone Encounter (Signed)
Please schedule the pt for a  appt to be seen

## 2022-08-05 ENCOUNTER — Ambulatory Visit: Payer: Commercial Managed Care - PPO | Admitting: Gastroenterology

## 2022-08-05 ENCOUNTER — Encounter: Payer: Self-pay | Admitting: Gastroenterology

## 2022-08-05 VITALS — BP 118/81 | HR 79 | Temp 97.8°F | Ht 64.0 in | Wt 157.4 lb

## 2022-08-05 DIAGNOSIS — R1084 Generalized abdominal pain: Secondary | ICD-10-CM | POA: Diagnosis not present

## 2022-08-05 DIAGNOSIS — K219 Gastro-esophageal reflux disease without esophagitis: Secondary | ICD-10-CM | POA: Diagnosis not present

## 2022-08-05 DIAGNOSIS — R197 Diarrhea, unspecified: Secondary | ICD-10-CM

## 2022-08-05 NOTE — Progress Notes (Signed)
GI Office Note    Referring Provider: Aliene Beams, MD Primary Care Physician:  Aliene Beams, MD Primary Gastroenterologist: Hennie Duos. Marletta Lor, DO  Date:  08/05/2022  ID:  Lenoria Chime, DOB 1981-01-22, MRN [id number]   Chief Complaint   Chief Complaint  Patient presents with   Follow-up    Diarrhea, abd cramps with diarrhea   History of Present Illness  RASHA IBE is a 42 y.o. female with a history of GERD presenting today with complaint of lower abdominal cramping with diarrhea.  Colonoscopy 2013 with hemorrhoids, otherwise unremarkable.  Last seen in the office 11/22/2021 by Dr. Marletta Lor.  Noted chronic worsening hoarseness and was evaluated by ENT who felt symptoms were related to acid reflux.  She denies any traditional heartburn symptoms, epigastric pain, or chest pain.  She was having some dysphagia primarily with food and sometimes pills.  She reported an EGD 12 years prior with dilation.  Denies any family history of colorectal malignancy.  Advised TCS at age 32.  Scheduled for EGD with dilation.  Recommended to start taking omeprazole daily.  Today:  Starts with abdominal cramping that progresses to nausea and sweating and then has diarrhea. Anything with grease if contributing to it. Red sauce can also cause these symptoms.Symptoms can occur in the morning but usually later in the afternoon. Does not eat after 7pm usually.   Had a HIDA scan in the past that showed 38% functioning. Has known history of IBS. Cherries made her have symptoms. Yesterday ate steaks. Has been trying to reduce red meat but had steak yesterday and ate bread. Last night it was about 20 minutes after eating. Abdominal pain is sometimes upper and lower abdominal pain. She has concerns regarding her insurance and what they will cover.   Has cut back on her chicken and feels better from that. Eats yogurt and granola for breakfast.   Takes her pantoprazole nightly.   Current Outpatient  Medications  Medication Sig Dispense Refill   drospirenone-ethinyl estradiol (YAZ) 3-0.02 MG tablet Take 1 tablet by mouth daily.     Ferrous Sulfate Dried (HIGH POTENCY IRON) 65 MG TABS Take 325 mg by mouth daily.     pantoprazole (PROTONIX) 40 MG tablet Take 1 tablet (40 mg total) by mouth daily. 30 tablet 11   hydrochlorothiazide (HYDRODIURIL) 12.5 MG tablet Take 1 tablet by mouth every morning.     No current facility-administered medications for this visit.    Past Medical History:  Diagnosis Date   GERD (gastroesophageal reflux disease)    INTERMITTENT SYMPTOMS   No pertinent past medical history    Seasonal allergies     Past Surgical History:  Procedure Laterality Date   BIOPSY  12/17/2021   Procedure: BIOPSY;  Surgeon: [name], DO;  [facility];  Service: Endoscopy;;   COLONOSCOPY  07/29/2011   Procedure: COLONOSCOPY;  Surgeon: West Bali, MD;  [facility];  Service: Endoscopy;  Laterality: N/A;  10:00   ESOPHAGOGASTRODUODENOSCOPY  04/08/2006   ESOPHAGOGASTRODUODENOSCOPY (EGD) WITH PROPOFOL N/A 12/17/2021   Procedure: ESOPHAGOGASTRODUODENOSCOPY (EGD) WITH PROPOFOL;  Surgeon: [name], DO;  [facility];  Service: Endoscopy;  Laterality: N/A;  8:00 am  ASA 1   FOOT SURGERY Left 03/10/2020   NO PAST SURGERIES      Family History  Problem Relation Age of Onset   Diabetes Mother    Myasthenia gravis Father    Cancer Maternal Aunt  Colon cancer Neg Hx    Colon polyps Neg Hx     Allergies as of 08/05/2022   (No Known Allergies)    Social History   Socioeconomic History   Marital status: Single    Spouse name: Not on file   Number of children: Not on file   Years of education: Not on file   Highest education level: Not on file  Occupational History   Not on file  Tobacco Use   Smoking status: Never   Smokeless tobacco: Never  Vaping Use   Vaping status: Not on file  Substance and Sexual Activity    Alcohol use: No   Drug use: No   Sexual activity: Not Currently    Birth control/protection: None  Other Topics Concern   Not on file  Social History Narrative   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   Single. Never married. No children.   Enjoys event planning/weddings.   Eats all food groups.   Does not eat pork.    Eats veggies/fruit.    Does not exercise.    Social Determinants of Health   Financial Resource Strain: Not on file  Food Insecurity: Not on file  Transportation Needs: Not on file  Physical Activity: Not on file  Stress: Not on file  Social Connections: Not on file     Review of Systems   Gen: Denies fever, chills, anorexia. Denies fatigue, weakness, weight loss.  CV: Denies chest pain, palpitations, syncope, peripheral edema, and claudication. Resp: Denies dyspnea at rest, cough, wheezing, coughing up blood, and pleurisy. GI: See HPI Derm: Denies rash, itching, dry skin Psych: Denies depression, anxiety, memory loss, confusion. No homicidal or suicidal ideation.  Heme: Denies bruising, bleeding, and enlarged lymph nodes.   Physical Exam   BP 118/81   Pulse 79   Temp 97.8 F (36.6 C)   Ht 5\' 4"  (1.626 m)   Wt 157 lb 6.4 oz (71.4 kg)   BMI 27.02 kg/m   General:   Alert and oriented. No distress noted. Pleasant and cooperative.  Head:  Normocephalic and atraumatic. Eyes:  Conjuctiva clear without scleral icterus. Mouth:  Oral mucosa pink and moist. Good dentition. No lesions.  Abdomen:  +BS, soft, non-tender and non-distended. No rebound or guarding. No HSM or masses noted. Rectal: deferred Msk:  Symmetrical without gross deformities. Normal posture. Extremities:  Without edema. Neurologic:  Alert and  oriented x4 Psych:  Alert and cooperative. Normal mood and affect.   Assessment  DELAILA NAND is a 42 y.o. female with a history of GERD presenting today with diarrhea and abdominal pain.   Diarrhea, abdominal pain: Symptoms could  be related to IBS but also abdominal pain is more in the upper abdominal region at times.  Has history of HIDA scan noting 38% gallbladder ejection fraction a few years prior and she feels as though this may be contributing to her symptoms.  Symptoms do occur postprandially with significant urgency, abdominal cramping, and diarrhea within 30 minutes of meals.  Query possible alpha gal as well however she is not aware of any prior tick bites.  Prior to considering any additional serologic testing we will trial colestipol.  GERD: Fairly well-controlled with pantoprazole 40 mg once daily.  Usually takes this in the evenings.  PLAN   Colestipol 2g once daily, increase to twice daily if needed. Continue pantoprazole 40 mg once daily.  Consider repeat HIDA scan Gallbladder eating plan Follow up in 8 weeks.  Brooke Bonito, MSN, FNP-BC, AGACNP-BC [facility]

## 2022-08-05 NOTE — Patient Instructions (Signed)
It was a pleasure to meet you today!  Please discuss if HIDA scan would be covered at least partially by your insurance with the CPT codes I provided for you today.  Start colestipol 2 g once daily.  If you see some improvement but still having a fair amount of diarrhea daily after meals let me know and we can increase this.  I have attached a gallbladder eating plan for you to look over just to make sure you are avoiding these typical triggers.  We will plan to follow-up in about 8 weeks.  You may reach out via MyChart if needed if things worsen.  It was a pleasure to see you today. I want to create trusting relationships with patients. If you receive a survey regarding your visit,  I greatly appreciate you taking time to fill this out on paper or through your MyChart. I value your feedback.  Brooke Bonito, MSN, FNP-BC, AGACNP-BC [facility]

## 2022-08-12 ENCOUNTER — Ambulatory Visit: Payer: Commercial Managed Care - PPO | Admitting: Physician Assistant

## 2022-08-12 ENCOUNTER — Ambulatory Visit (HOSPITAL_COMMUNITY)
Admission: RE | Admit: 2022-08-12 | Discharge: 2022-08-12 | Disposition: A | Discharge status: Home / Self Care | Payer: Commercial Managed Care - PPO | Source: Ambulatory Visit | Attending: Vascular Surgery | Admitting: Vascular Surgery

## 2022-08-12 VITALS — BP 120/79 | HR 78 | Temp 97.5°F | Resp 16 | Ht 64.0 in | Wt 155.0 lb

## 2022-08-12 DIAGNOSIS — M7989 Other specified soft tissue disorders: Secondary | ICD-10-CM

## 2022-08-12 NOTE — Progress Notes (Signed)
[facility] [place]   Reason for referral: Swollen B legs  History of Present Illness  Vanessa [name] is a 42 y.o. female who presents with chief complaint: swollen leg.  Patient notes, onset of swelling >12 months ago, associated with prolonged sitting and standing for work.  She is a hair stylists.    She has no history of DVT, no history of varicose vein, no history of venous stasis ulcers, no history of  Lymphedema and no history of skin changes in lower legs.  There is a family history of venous disorders.  The patient does use compression stockings in the daily.  She is on lasix for edema daily.  She has no history of CHF or trauma.         Past Medical History:  Diagnosis Date   GERD (gastroesophageal reflux disease)    INTERMITTENT SYMPTOMS   No pertinent past medical history    Seasonal allergies     Past Surgical History:  Procedure Laterality Date   BIOPSY  12/17/2021   Procedure: BIOPSY;  Surgeon: [name], DO;  [facility];  Service: Endoscopy;;   COLONOSCOPY  07/29/2011   Procedure: COLONOSCOPY;  Surgeon: West Bali, MD;  [facility];  Service: Endoscopy;  Laterality: N/A;  10:00   ESOPHAGOGASTRODUODENOSCOPY  04/08/2006   ESOPHAGOGASTRODUODENOSCOPY (EGD) WITH PROPOFOL N/A 12/17/2021   Procedure: ESOPHAGOGASTRODUODENOSCOPY (EGD) WITH PROPOFOL;  Surgeon: [name], DO;  [facility];  Service: Endoscopy;  Laterality: N/A;  8:00 am  ASA 1   FOOT SURGERY Left 03/10/2020   NO PAST SURGERIES      Social History   Socioeconomic History   Marital status: Single    Spouse name: Not on file   Number of children: Not on file   Years of education: Not on file   Highest education level: Not on file  Occupational History   Not on file  Tobacco Use   Smoking status: Never   Smokeless tobacco: Never  Vaping Use   Vaping status: Not on file  Substance and Sexual Activity   Alcohol use: No    Drug use: No   Sexual activity: Not Currently    Birth control/protection: None  Other Topics Concern   Not on file  Social History Narrative   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 Never married. No children.   Enjoys event planning/weddings.   Eats all food groups.   Does not eat pork.    Eats veggies/fruit.    Does not exercise.    Social Determinants of Health   Financial Resource Strain: Not on file  Food Insecurity: Not on file  Transportation Needs: Not on file  Physical Activity: Not on file  Stress: Not on file  Social Connections: Not on file  Intimate Partner Violence: Not on file    Family History  Problem Relation Age of Onset   Diabetes Mother    Myasthenia gravis Father    Cancer Maternal Aunt    Colon cancer Neg Hx    Colon polyps Neg Hx     Current Outpatient Medications on File Prior to Visit  Medication Sig Dispense Refill   drospirenone-ethinyl estradiol (YAZ) 3-0.02 MG tablet Take 1 tablet by mouth daily.     Ferrous Sulfate Dried (HIGH POTENCY IRON) 65 MG TABS Take 325 mg by mouth daily.     hydrochlorothiazide (HYDRODIURIL) 12.5 MG tablet Take 1 tablet  by mouth every morning.     pantoprazole (PROTONIX) 40 MG tablet Take 1 tablet (40 mg total) by mouth daily. 30 tablet 11   No current facility-administered medications on file prior to visit.    Allergies as of 08/12/2022   (No Known Allergies)     ROS:   General:  No weight loss, Fever, chills  HEENT: No recent headaches, no nasal bleeding, no visual changes, no sore throat  Neurologic: No dizziness, blackouts, seizures. No recent symptoms of stroke or mini- stroke. No recent episodes of slurred speech, or temporary blindness.  Cardiac: No recent episodes of chest pain/pressure, no shortness of breath at rest.  No shortness of breath with exertion.  Denies history of atrial fibrillation or irregular heartbeat  Vascular: No history of rest pain in feet.  No history of  claudication.  No history of non-healing ulcer, No history of DVT   Pulmonary: No home oxygen, no productive cough, no hemoptysis,  No asthma or wheezing  Musculoskeletal:  [ ]  Arthritis, [ ]  Low back pain,  [ ]  Joint pain  Hematologic:No history of hypercoagulable state.  No history of easy bleeding.  No history of anemia  Gastrointestinal: No hematochezia or melena,  No gastroesophageal reflux, no trouble swallowing  Urinary: [ ]  chronic Kidney disease, [ ]  on HD - [ ]  MWF or [ ]  TTHS, [ ]  Burning with urination, [ ]  Frequent urination, [ ]  Difficulty urinating;   Skin: No rashes  Psychological: No history of anxiety,  No history of depression  Physical Examination  Vitals:   08/12/22 1302  BP: 120/79  Pulse: 78  Resp: 16  Temp: (!) 97.5 F (36.4 C)  TempSrc: Temporal  SpO2: 97%  Weight: 155 lb (70.3 kg)  Height: 5\' 4"  (1.626 m)    Body mass index is 26.61 kg/m.  General:  Alert and oriented, no acute distress HEENT: Normal Neck: No bruit or JVD Pulmonary: Clear to auscultation bilaterally Cardiac: Regular Rate and Rhythm without murmur Abdomen: Soft, non-tender, non-distended, no mass, no scars Skin: No rash Extremity Pulses:  2+ radial,  femoral, dorsalis pedis,  pulses bilaterally Musculoskeletal: No deformity or edema  Neurologic: Upper and lower extremity motor 5/5 and symmetric  DATA: Venous Reflux Times  +--------------+---------+------+-----------+------------+-------------+  RIGHT        Reflux NoRefluxReflux TimeDiameter cmsComments                               Yes                                        +--------------+---------+------+-----------+------------+-------------+  CFV                    yes   >1 second                            +--------------+---------+------+-----------+------------+-------------+  FV mid        no                                                    +--------------+---------+------+-----------+------------+-------------+  Popliteal    no                                                   +--------------+---------+------+-----------+------------+-------------+  GSV at [facility]    no                            0.56                   +--------------+---------+------+-----------+------------+-------------+  GSV prox thighno                            0.41                   +--------------+---------+------+-----------+------------+-------------+  GSV mid thigh no                            0.31                   +--------------+---------+------+-----------+------------+-------------+  GSV dist thigh                                      out of fascia  +--------------+---------+------+-----------+------------+-------------+  SSV Pop Fossa no                            0.27                   +--------------+---------+------+-----------+------------+-------------+  SSV prox calf no                            0.19                   +--------------+---------+------+-----------+------------+-------------+     Summary:  Right:  - No evidence of deep vein thrombosis seen in the right lower extremity,  from the common femoral through the popliteal veins.  - No evidence of superficial venous thrombosis in the right lower  extremity.    - The deep venous system is incompetent at the common femoral vein.  - The great saphenous vein is competent.  - The small saphenous vein is competent.   Assessment/Plan:  B LE edema without evidence of venous reflux on the venous duplex today  She states she has good control of the edema with lasix, compression and exercise.   No skin changes.  She denies symptoms of claudication, rest pain or non healing wounds.  She has palpable pedal pulses and is not at risk of limb loss.   Elevation ,compression, exercise and f/u as needed in the future.      Mosetta Pigeon PA-C [facility] [place] Office: [phone]  MD on call Randie Heinz

## 2022-09-30 ENCOUNTER — Ambulatory Visit: Payer: Commercial Managed Care - PPO | Admitting: Gastroenterology

## 2022-12-02 ENCOUNTER — Other Ambulatory Visit: Payer: Self-pay | Admitting: Family Medicine

## 2022-12-02 DIAGNOSIS — Z1231 Encounter for screening mammogram for malignant neoplasm of breast: Secondary | ICD-10-CM

## 2022-12-30 ENCOUNTER — Ambulatory Visit
Admission: RE | Admit: 2022-12-30 | Discharge: 2022-12-30 | Disposition: A | Discharge status: Home / Self Care | Payer: Commercial Managed Care - PPO | Source: Ambulatory Visit | Attending: Family Medicine

## 2022-12-30 DIAGNOSIS — Z1231 Encounter for screening mammogram for malignant neoplasm of breast: Secondary | ICD-10-CM

## 2023-01-01 ENCOUNTER — Other Ambulatory Visit: Payer: Self-pay | Admitting: Internal Medicine

## 2023-03-27 DIAGNOSIS — H43393 Other vitreous opacities, bilateral: Secondary | ICD-10-CM | POA: Diagnosis not present

## 2023-03-27 DIAGNOSIS — H5213 Myopia, bilateral: Secondary | ICD-10-CM | POA: Diagnosis not present

## 2023-03-27 DIAGNOSIS — Z8669 Personal history of other diseases of the nervous system and sense organs: Secondary | ICD-10-CM | POA: Diagnosis not present

## 2023-05-01 DIAGNOSIS — R0981 Nasal congestion: Secondary | ICD-10-CM | POA: Diagnosis not present

## 2023-05-01 DIAGNOSIS — R051 Acute cough: Secondary | ICD-10-CM | POA: Diagnosis not present

## 2023-05-01 DIAGNOSIS — R509 Fever, unspecified: Secondary | ICD-10-CM | POA: Diagnosis not present

## 2023-05-01 DIAGNOSIS — R52 Pain, unspecified: Secondary | ICD-10-CM | POA: Diagnosis not present

## 2023-09-01 DIAGNOSIS — Z3041 Encounter for surveillance of contraceptive pills: Secondary | ICD-10-CM | POA: Diagnosis not present

## 2023-09-01 DIAGNOSIS — Z01419 Encounter for gynecological examination (general) (routine) without abnormal findings: Secondary | ICD-10-CM | POA: Diagnosis not present

## 2023-09-03 DIAGNOSIS — G43B0 Ophthalmoplegic migraine, not intractable: Secondary | ICD-10-CM | POA: Diagnosis not present

## 2023-09-03 DIAGNOSIS — H43391 Other vitreous opacities, right eye: Secondary | ICD-10-CM | POA: Diagnosis not present

## 2023-09-23 ENCOUNTER — Other Ambulatory Visit: Payer: Self-pay | Admitting: Family Medicine

## 2023-09-23 DIAGNOSIS — R1011 Right upper quadrant pain: Secondary | ICD-10-CM | POA: Diagnosis not present

## 2023-09-23 DIAGNOSIS — K219 Gastro-esophageal reflux disease without esophagitis: Secondary | ICD-10-CM | POA: Diagnosis not present

## 2023-09-23 DIAGNOSIS — R1310 Dysphagia, unspecified: Secondary | ICD-10-CM | POA: Diagnosis not present

## 2023-09-23 DIAGNOSIS — Z0000 Encounter for general adult medical examination without abnormal findings: Secondary | ICD-10-CM | POA: Diagnosis not present

## 2023-09-23 DIAGNOSIS — Z13220 Encounter for screening for lipoid disorders: Secondary | ICD-10-CM | POA: Diagnosis not present

## 2023-09-29 ENCOUNTER — Ambulatory Visit
Admission: RE | Admit: 2023-09-29 | Discharge: 2023-09-29 | Disposition: A | Discharge status: Home / Self Care | Source: Ambulatory Visit | Attending: Family Medicine | Admitting: Family Medicine

## 2023-09-29 DIAGNOSIS — Z13220 Encounter for screening for lipoid disorders: Secondary | ICD-10-CM | POA: Diagnosis not present

## 2023-09-29 DIAGNOSIS — Z0000 Encounter for general adult medical examination without abnormal findings: Secondary | ICD-10-CM | POA: Diagnosis not present

## 2023-09-29 DIAGNOSIS — R1011 Right upper quadrant pain: Secondary | ICD-10-CM | POA: Diagnosis not present

## 2023-10-27 DIAGNOSIS — R1319 Other dysphagia: Secondary | ICD-10-CM | POA: Diagnosis not present

## 2023-10-27 DIAGNOSIS — K219 Gastro-esophageal reflux disease without esophagitis: Secondary | ICD-10-CM | POA: Diagnosis not present

## 2023-10-27 DIAGNOSIS — K529 Noninfective gastroenteritis and colitis, unspecified: Secondary | ICD-10-CM | POA: Diagnosis not present

## 2023-10-27 DIAGNOSIS — R1011 Right upper quadrant pain: Secondary | ICD-10-CM | POA: Diagnosis not present

## 2023-11-20 DIAGNOSIS — M79641 Pain in right hand: Secondary | ICD-10-CM | POA: Diagnosis not present

## 2023-12-01 DIAGNOSIS — R1310 Dysphagia, unspecified: Secondary | ICD-10-CM | POA: Diagnosis not present

## 2023-12-01 DIAGNOSIS — K2289 Other specified disease of esophagus: Secondary | ICD-10-CM | POA: Diagnosis not present

## 2023-12-01 DIAGNOSIS — K229 Disease of esophagus, unspecified: Secondary | ICD-10-CM | POA: Diagnosis not present

## 2023-12-01 DIAGNOSIS — K3189 Other diseases of stomach and duodenum: Secondary | ICD-10-CM | POA: Diagnosis not present

## 2023-12-01 DIAGNOSIS — B3781 Candidal esophagitis: Secondary | ICD-10-CM | POA: Diagnosis not present
